# Patient Record
Sex: Male | Born: 1955 | Race: White | Hispanic: No | Marital: Married | Smoking: Never smoker
Health system: Southern US, Community
[De-identification: ages and names within clinical notes are randomized; demographics above are authoritative.]

## PROBLEM LIST (undated history)

## (undated) DIAGNOSIS — M1990 Unspecified osteoarthritis, unspecified site: Secondary | ICD-10-CM

## (undated) DIAGNOSIS — J45909 Unspecified asthma, uncomplicated: Secondary | ICD-10-CM

## (undated) DIAGNOSIS — M069 Rheumatoid arthritis, unspecified: Secondary | ICD-10-CM

## (undated) DIAGNOSIS — I2510 Atherosclerotic heart disease of native coronary artery without angina pectoris: Secondary | ICD-10-CM

## (undated) DIAGNOSIS — E785 Hyperlipidemia, unspecified: Secondary | ICD-10-CM

## (undated) DIAGNOSIS — K219 Gastro-esophageal reflux disease without esophagitis: Secondary | ICD-10-CM

## (undated) HISTORY — DX: Unspecified asthma, uncomplicated: J45.909

## (undated) HISTORY — DX: Atherosclerotic heart disease of native coronary artery without angina pectoris: I25.10

## (undated) HISTORY — DX: Hyperlipidemia, unspecified: E78.5

## (undated) HISTORY — PX: OLECRANON BURSECTOMY: SHX2097

## (undated) HISTORY — PX: APPENDECTOMY: SHX54

## (undated) HISTORY — DX: Rheumatoid arthritis, unspecified: M06.9

---

## 2009-09-27 ENCOUNTER — Encounter: Payer: Self-pay | Admitting: Pulmonary Disease

## 2010-05-18 ENCOUNTER — Ambulatory Visit: Payer: Self-pay | Admitting: Pulmonary Disease

## 2010-05-18 DIAGNOSIS — R05 Cough: Secondary | ICD-10-CM | POA: Insufficient documentation

## 2010-05-18 DIAGNOSIS — E785 Hyperlipidemia, unspecified: Secondary | ICD-10-CM | POA: Insufficient documentation

## 2010-05-18 DIAGNOSIS — M069 Rheumatoid arthritis, unspecified: Secondary | ICD-10-CM | POA: Insufficient documentation

## 2010-10-24 NOTE — Assessment & Plan Note (Signed)
Summary: consult for chronic cough   Visit Type:  Initial Consult Copy to:  Beatrix Shipper [facility]) Primary Provider/Referring Provider:  Dr. Merri Brunette  CC:  Pulmonary consult.  The patient c/o a non-prod cough.  Worse x1 year.Marland Kitchen  History of Present Illness: The pt is a 55y/o male who I have been asked to see for chronic cough.  The pt states it started approx. one year ago spontaneously, and was primarily in the am's.  He would notice audible wheezing, but minimal to no mucus.  He doesn't describe this as an uncontrollable cough, but rather something that he does in order to make a sensation of "congestion" or "airway irritation" feel better.  He was initially treated with a zpak, but cough persisted.  Over the past one year, has been up and down in severity.  The last few months, it has spontaneously improved, and currently rates a 2/10 with 10 being the worst it has been and 0 no cough.  He describes a classic upper airway pseudowheeze at night while lying down.  He has no worsening sob or change in his exertional tolerance.  The pt has no h/o childhood asthma, and has never smoked.  He has had a recent cxr that is not available to me, but the pt was told by his rheumatologist that it was clear.  He denies any postnasal drip or ongoing sinus issues.  He does have some reflux symptoms intermittantly that started a year ago, and takes TUMS as needed.    Preventive Screening-Counseling & Management  Alcohol-Tobacco     Smoking Status: never  Current Medications (verified): 1)  Enbrel 50 Mg/ml Soln (Etanercept) .... Once Weekly 2)  Trilipix 135 Mg Cpdr (Choline Fenofibrate) .Marland Kitchen.. 1 By Mouth Daily  Allergies (verified): 1)  ! * Pcn  Past History:  Past Medical History:  HYPERLIPIDEMIA (ICD-272.4) RHEUMATOID ARTHRITIS (ICD-714.0) OSA--apnea link with 32/hr.  Past Surgical History: No surgical history  Family History: Reviewed history and no changes required. No significant family  history  Social History: Reviewed history and no changes required. Patient never smoked.  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  The patient denies shortness of breath with activity, shortness of breath at rest, productive cough, coughing up blood, chest pain, irregular heartbeats, acid heartburn, indigestion, loss of appetite, weight change, abdominal pain, difficulty swallowing, sore throat, tooth/dental problems, headaches, nasal congestion/difficulty breathing through nose, sneezing, itching, ear ache, anxiety, depression, hand/feet swelling, joint stiffness or pain, rash, change in color of mucus, and fever.    Vital Signs:  Patient profile:   55 year old male Height:      70 inches (177.80 cm) Weight:      196.38 pounds (89.26 kg) BMI:     28.28 O2 Sat:      98 % on Room air Temp:     98.0 degrees F (36.67 degrees C) oral Pulse rate:   72 / minute BP sitting:   122 / 80  (left arm) Cuff size:   regular  Vitals Entered By: Michel Bickers CMA (May 18, 2010 9:28 AM)  O2 Sat at Rest %:  98 O2 Flow:  Room air CC: Pulmonary consult.  The patient c/o a non-prod cough.  Worse x1 year. Is Patient Diabetic? No Comments Medications reviewed with the patient. Daytime phone verified. Michel Bickers [facility]  May 18, 2010 9:38 AM   Physical Exam  General:  wd male in nad Eyes:  PERRLA and EOMI.   Nose:  mild turbinate hypertrophy, crusted secretions in left nare Mouth:  moderate elongation of soft palate and uvula. Neck:  no jvd, tmg, LN Lungs:  totally clear to auscultation Heart:  rrr, no mrg Abdomen:  soft and nontender, bs+ Extremities:  no edema or cyanosis  pulses intact distally Neurologic:  alert and oriented, moves all 4.   Impression & Recommendations:  Problem # 1:  COUGH (ICD-786.2)  the pt's cough sounds much more upper airway in  origin than lower.  His spirometry does not suggest cough variant asthma, but this is not 100%.  There is nothing to suggest arytenoid dysfunction related to his RA.  I suspect this is related to LPR, given his intermittant reflux symptoms that he takes TUMS for.  His cough started  about the same time that his reflux started bothering him.  I have also asked him to keep postnasal drip in mind as well, and have recommended otc antihistamine at HS if he has symptoms.  Will treat with PPI for next 3 weeks and see how he responds.  If no change, would consider methacholine challenge testing or possibly check scan of sinuses.  The pt will get his cxr for my review on disc.    Medications Added to Medication List This Visit: 1)  Enbrel 50 Mg/ml Soln (Etanercept) .... Once weekly 2)  Trilipix 135 Mg Cpdr (Choline fenofibrate) .Marland Kitchen.. 1 by mouth daily  Other Orders: Consultation Level IV (62130) Spirometry w/Graph (94010)  Patient Instructions: 1)  will try dexilant 60mg  one each night at bedtime for next 3 weeks.  Please call and give me update with how things are going. 2)  if you are noticing postnasal drip, would get chlorpheniramine 8mg  and take at bedtime as well.   CardioPerfect Spirometry  ID: [id number] Patient: Dennis Lozano, Dennis Lozano DOB: [birth year] Age: 55 Years Old Sex: Male Race: White Height: 70 Weight: 196.38 Status: Confirmed Recorded: 05/18/2010 10:12 AM  Parameter  Measured Predicted %Predicted FVC     3.44        4.84        71 FEV1     2.53        3.70        68.50 FEV1%   73.73        76.50        96.40 PEF    4.88        9.43        51.80   Interpretation: There is no obstruction based on FEV1%.  His FVC is mildly decreased either due to mild airtrapping or mild restriction.  The pt did have cough during manuevers which may contribute to the findings.

## 2010-11-02 ENCOUNTER — Other Ambulatory Visit: Payer: Self-pay | Admitting: Orthopaedic Surgery

## 2010-11-10 ENCOUNTER — Ambulatory Visit
Admission: RE | Admit: 2010-11-10 | Discharge: 2010-11-10 | Disposition: A | Discharge status: Home / Self Care | Payer: BC Managed Care – PPO | Source: Ambulatory Visit | Attending: Orthopaedic Surgery | Admitting: Orthopaedic Surgery

## 2011-02-09 ENCOUNTER — Encounter: Payer: Self-pay | Admitting: Pulmonary Disease

## 2011-02-09 ENCOUNTER — Ambulatory Visit (INDEPENDENT_AMBULATORY_CARE_PROVIDER_SITE_OTHER): Payer: BC Managed Care – PPO | Admitting: Pulmonary Disease

## 2011-02-09 VITALS — BP 120/82 | HR 64 | Temp 98.2°F | Ht 70.0 in | Wt 191.6 lb

## 2011-02-09 DIAGNOSIS — R0609 Other forms of dyspnea: Secondary | ICD-10-CM

## 2011-02-09 DIAGNOSIS — R059 Cough, unspecified: Secondary | ICD-10-CM

## 2011-02-09 DIAGNOSIS — R05 Cough: Secondary | ICD-10-CM

## 2011-02-09 NOTE — Progress Notes (Signed)
  Subjective:    Patient ID: Dennis Lozano, male    DOB: [birth year], 55 y.o.   MRN: [id number]  HPI The pt comes in today for an acute sick visit.  He was last seen in august of last year with chronic cough, and it was initially felt to be more upper airway in origin.  His lungs were clear at that time, and his spirometry was normal.  He was placed on a trial of PPI for possible LPR, and asked to call in a few weeks with his progress.  He never called, and unfortunately has had worsening cough and now worsening sob since that time.  He has been tried on prn albuterol by Dr. Renne Crigler, with no change.  He has had spirometry recently that showed very mild airflow obstruction.  The pt is very concerned this is not asthma, and is worried it may be related to his RA.  He did take some prednisone on his own recently to see if it would help, and he tells me he had resolution of his cough and dyspnea.  He has had recent cxr that unfortunately has "mottling" that can simulate IS process, but I suspect is unremarkable.  Are getting xray report for radiology opinion.    Review of Systems  Constitutional: Negative for fever and unexpected weight change.  HENT: Negative for ear pain, nosebleeds, congestion, sore throat, rhinorrhea, sneezing, trouble swallowing, dental problem, postnasal drip and sinus pressure.   Eyes: Negative for redness and itching.  Respiratory: Positive for cough, shortness of breath and wheezing. Negative for chest tightness.   Cardiovascular: Negative for palpitations and leg swelling.  Gastrointestinal: Negative for nausea and vomiting.  Genitourinary: Negative for dysuria.  Musculoskeletal: Negative for joint swelling.  Skin: Negative for rash.  Neurological: Negative for headaches.  Hematological: Does not bruise/bleed easily.  Psychiatric/Behavioral: Negative for dysphoric mood. The patient is not nervous/anxious.        Objective:   Physical Exam Thin male in nad Nares without  purulence or crusting Chest with bilat wheezing, pops, no crackles Cor with rrr LE without edema, on cyanosis  Alert and oriented,  Moves all 4        Assessment & Plan:

## 2011-02-09 NOTE — Assessment & Plan Note (Signed)
The pt's history and exam is most c/w asthma.  He is concerned this is not the right diagnosis, and that it may be related to his RA or biologic treatment.  I have had a long discussion with him about this, and have compromised that we will do full pfts with lung volumes and DLCO to further evaluate his lungs.  If DLCO is normal, very unlikely he has ISLD from RA.

## 2011-02-09 NOTE — Assessment & Plan Note (Signed)
The pt continues to have cough, and given his wheezing today and recent spirometry that suggests airflow obstruction, I suspect this is due to asthma.  He even has a +response to prednisone.

## 2011-02-09 NOTE — Patient Instructions (Signed)
Will set up for breathing studies week after next.  Please call if having worsening issues before then. Will call you once the results are available to me.

## 2011-02-12 ENCOUNTER — Telehealth: Payer: Self-pay | Admitting: *Deleted

## 2011-02-12 NOTE — Telephone Encounter (Signed)
lmomtcb for Dennis Lozano in medical records at [facility].

## 2011-02-14 NOTE — Telephone Encounter (Signed)
CXR result received and given to Dr. Shelle Iron.

## 2011-02-20 ENCOUNTER — Telehealth: Payer: Self-pay | Admitting: Pulmonary Disease

## 2011-02-20 NOTE — Telephone Encounter (Signed)
ATC pt, NA and msg states that "all circiuts are busy...". Unable to leave msg,WCB.

## 2011-02-21 NOTE — Telephone Encounter (Signed)
lmomtcb  

## 2011-02-22 NOTE — Telephone Encounter (Signed)
lmomtcb x 2  

## 2011-02-23 NOTE — Telephone Encounter (Signed)
Pt saw dr Lucie Leather for allergy problems 3-4 days ago, dr Lucie Leather wanted to start pt on treatment process right away, so pt had called [place] to get [facility] opinion on this but we played phone tag for 3-4 days so when dr Lucie Leather called back the pt went ahead and started the rec. Treatment plan which was-- prednisone he is on his 3rd day of the taper, dulera 2 pfs bid and singulair once daily and nasonex once daily, the pt states he does feel some better, but his question is [facility] set him up for full pft's and that appt is on Monday 6/4-pt would like to know if he needs to keep this appt since the meds he is on will interfere with the test per dr kozlow--pls advise

## 2011-02-23 NOTE — Telephone Encounter (Signed)
Let pt know that I thought he had asthma, and the treatment plan outlined by Dr. Lucie Leather will treat asthma well. The breathing test scheduled was to look at total lung capacity and diffusion capacity to see if RA affecting lungs.  These variables will not be influenced by meds from dr Lucie Leather.  Would go ahead and do testing as we have planned.

## 2011-02-23 NOTE — Telephone Encounter (Signed)
Pt aware dr clance wants him to keep the appt for the test--pt states he will be here monday

## 2011-02-23 NOTE — Telephone Encounter (Signed)
Phoned returning a call to triage he can be reached at [phone].[place] Nickel

## 2011-02-26 ENCOUNTER — Ambulatory Visit (INDEPENDENT_AMBULATORY_CARE_PROVIDER_SITE_OTHER): Payer: BC Managed Care – PPO | Admitting: Pulmonary Disease

## 2011-02-26 DIAGNOSIS — R05 Cough: Secondary | ICD-10-CM

## 2011-02-26 DIAGNOSIS — R0989 Other specified symptoms and signs involving the circulatory and respiratory systems: Secondary | ICD-10-CM

## 2011-02-26 LAB — PULMONARY FUNCTION TEST

## 2011-02-26 NOTE — Progress Notes (Signed)
PFT done today. 

## 2011-03-02 ENCOUNTER — Telehealth: Payer: Self-pay | Admitting: Pulmonary Disease

## 2011-03-02 NOTE — Telephone Encounter (Signed)
pfts are totally normal  Megan, please let pt know that his pfts are totally normal.  There is nothing to suggest that his RA is affecting his lungs.  I think this is asthma, and needs to continue on inhaled medications.

## 2011-03-06 NOTE — Telephone Encounter (Signed)
Called and spoke with pt.  Pt aware of PFT results and [facility]'s response/recs.  Pt verbalized understanding and denied any questions.

## 2011-03-22 ENCOUNTER — Encounter: Payer: Self-pay | Admitting: Pulmonary Disease

## 2011-12-13 ENCOUNTER — Other Ambulatory Visit: Payer: Self-pay | Admitting: *Deleted

## 2011-12-13 NOTE — Progress Notes (Signed)
error 

## 2012-05-15 ENCOUNTER — Other Ambulatory Visit: Payer: Self-pay | Admitting: *Deleted

## 2014-03-04 ENCOUNTER — Encounter: Payer: Self-pay | Admitting: *Deleted

## 2014-03-05 ENCOUNTER — Encounter: Payer: Self-pay | Admitting: Cardiovascular Disease

## 2014-03-05 ENCOUNTER — Ambulatory Visit (INDEPENDENT_AMBULATORY_CARE_PROVIDER_SITE_OTHER): Payer: BC Managed Care – PPO | Admitting: Cardiovascular Disease

## 2014-03-05 ENCOUNTER — Encounter (INDEPENDENT_AMBULATORY_CARE_PROVIDER_SITE_OTHER): Payer: Self-pay

## 2014-03-05 VITALS — BP 112/88 | HR 77 | Ht 69.5 in | Wt 196.0 lb

## 2014-03-05 DIAGNOSIS — I493 Ventricular premature depolarization: Secondary | ICD-10-CM | POA: Insufficient documentation

## 2014-03-05 DIAGNOSIS — I4949 Other premature depolarization: Secondary | ICD-10-CM

## 2014-03-05 NOTE — Progress Notes (Signed)
Dennis Lozano Date of Birth  04-20-1956       [facility]    [place] [facility] [address], [address]  [address], [address] [place]  [id number]   [place]  [id number] [phone]     [phone]   Fax  [phone]     Fax [phone]  Problem List: 1. Premature ventricular contractions   History of Present Illness:  Dennis Lozano is a 58 yo who is referred for palpitation. He was seen by his medical doctor - found to have PVCs.   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   He has an ECG app on his phone and diagnosed himself with PVCs.    His life is very consistent - same food, same exercise regimine, same sleep.   Few beers a week Nonsmoker Fhx:  Negative, father is still alive and healthy at 6488, mother diet of complication of rheumatoid arthritis.     He denies any dyspnea,  Or CP did have some extra fatigue.  He recently had an episode of hives, received steroids, 1 SQ dose of Epi.    These PVCs started about 1 week later.    He feels pretty normal today.    Current Outpatient Prescriptions on File Prior to Visit  Medication Sig Dispense Refill  . Cholecalciferol (VITAMIN D-3) 1000 UNITS CAPS Take by mouth daily.      Marland Kitchen. EPINEPHrine 0.3 mg/0.3 mL IJ SOAJ injection Inject into the muscle as needed.      . etanercept (ENBREL) 25 MG injection Inject 25 mg into the skin once a week.        . mometasone-formoterol (DULERA) 200-5 MCG/ACT AERO Inhale 2 puffs into the lungs as needed for wheezing.      . montelukast (SINGULAIR) 10 MG tablet Take 10 mg by mouth at bedtime.       No current facility-administered medications on file prior to visit.    Allergies  Allergen Reactions  . Penicillins     Past Medical History  Diagnosis Date  . Hyperlipidemia   . Rheumatoid arthritis(714.0)   . OSA (obstructive sleep apnea)     apnea link 32/hr  . Vitamin D deficiency   . Asthma     Past Surgical History  Procedure Laterality Date  .  Appendectomy    . Olecranon bursectomy      History  Smoking status  . Never Smoker   Smokeless tobacco  . Not on file    History  Alcohol Use  . Yes    Comment: occasional    Family History  Problem Relation Age of Onset  . Rheum arthritis Mother     Reviw of Systems:  Reviewed in the HPI.  All other systems are negative.  Physical Exam: Blood pressure 112/88, pulse 77, height 5' 9.5" (1.765 m), weight 196 lb (88.905 kg). Wt Readings from Last 3 Encounters:  03/05/14 196 lb (88.905 kg)  02/09/11 191 lb 9.6 oz (86.909 kg)  05/18/10 196 lb 6.1 oz (89.078 kg)     General: Well developed, well nourished, in no acute distress.  Head: Normocephalic, atraumatic, sclera non-icteric, mucus membranes are moist,   Neck: Supple. Carotids are 2 + without bruits. No JVD   Lungs: Clear   Heart: RR, normal S1S2, rare PVCs  Abdomen: Soft, non-tender, non-distended with normal bowel sounds.  Msk:  Strength and tone are normal   Extremities: No clubbing or cyanosis. No edema.  Distal pedal pulses are 2+ and equal    Neuro: CN II - XII intact.  Alert and oriented X 3.   Psych:  Normal   ECG: 03/05/2014: Normal sinus rhythm at 77. He has no ST or T wave changes.  His previous EKG at his medical doctor's office revealed normal sinus rhythm with frequent premature ventricular contractions.  Assessment / Plan:

## 2014-03-05 NOTE — Patient Instructions (Signed)
Your physician recommends that you continue on your current medications as directed. Please refer to the Current Medication list given to you today.  Your physician recommends that you schedule a follow-up appointment as needed with Dr. Nahser.  

## 2014-03-05 NOTE — Assessment & Plan Note (Signed)
Presents today for further evaluation and management of his premature ventricular contractions. Actually diagnosed himself with an apical Ms. telephone. He was able to record a very nice EKG with frequent premature ventricular contractions.  The exact etiology of these PVCs is unclear. They have greatly improved. He had an episode of hives and subsequently took steroids, antihistamines, and a dose of epinephrine. At the PVCs started following this treatment. I suspect that the PVCs are related to his steroid treatment, possible hypokalemia, epinephrine treatment or other.  He has normal exercise capacity and would suspect that his LV function is normal. I have No reason to think that he has a cardiomyopathy. At this point I think that his PVCs are benign. He can followup with us on an as-needed basis.

## 2016-03-01 ENCOUNTER — Encounter: Payer: Self-pay | Admitting: Podiatry

## 2016-03-01 ENCOUNTER — Ambulatory Visit (INDEPENDENT_AMBULATORY_CARE_PROVIDER_SITE_OTHER): Payer: BC Managed Care – PPO | Admitting: Podiatry

## 2016-03-01 VITALS — BP 152/95 | HR 71 | Resp 16 | Ht 69.5 in | Wt 190.0 lb

## 2016-03-01 DIAGNOSIS — B351 Tinea unguium: Secondary | ICD-10-CM | POA: Diagnosis not present

## 2016-03-01 DIAGNOSIS — L600 Ingrowing nail: Secondary | ICD-10-CM | POA: Diagnosis not present

## 2016-03-01 NOTE — Patient Instructions (Signed)

## 2016-03-01 NOTE — Progress Notes (Signed)
   Subjective:    Patient ID: Dennis Lozano, male    DOB: 1956-02-20, 60 y.o.   MRN: [id number]  HPI Chief Complaint  Patient presents with  . Nail Problem    Right foot; great toe-lateral; x2 weeks      Review of Systems  All other systems reviewed and are negative.      Objective:   Physical Exam        Assessment & Plan:

## 2016-03-02 NOTE — Progress Notes (Signed)
Subjective:     Patient ID: Dennis Lozano, male   DOB: Jul 24, 1956, 60 y.o.   MRN: [id number]  HPI patient presents stating I have had trouble with my nails with fungus and I have developed ingrown toenails in the outside border of both that becomes very tender when I try to wear shoe gear and I have tried to trim them and soak them without relief and it's been present for a long time   Review of Systems  All other systems reviewed and are negative.      Objective:   Physical Exam  Constitutional: He is oriented to person, place, and time.  Cardiovascular: Intact distal pulses.   Musculoskeletal: Normal range of motion.  Neurological: He is oriented to person, place, and time.  Skin: Skin is warm.  Nursing note and vitals reviewed.  neurovascular status intact muscle strength adequate range of motion within normal limits with patient found to have thickened hallux nails bilateral with yellow discoloration in the left over right nail with incurvation of the lateral borders bilateral that are painful when pressed and irritated with redness at the tip with no active drainage. 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 At this point I recommended correction of the nails and I explained procedure and risk and I infiltrated each hallux 60 mg Xylocaine Marcaine mixture remove the lateral borders exposed matrix and applied phenol 3 applications 30 seconds followed by alcohol lavage and sterile dressing. Gave instructions on soaks and reappoint

## 2016-03-09 ENCOUNTER — Telehealth: Payer: Self-pay | Admitting: *Deleted

## 2016-03-09 NOTE — Telephone Encounter (Signed)
Left message for patient at [phone] (Home #) to check to see how they were doing from their ingrown toenail procedure that was performed on Thursday, March 01, 2016. Waiting for a response.

## 2016-07-05 ENCOUNTER — Ambulatory Visit: Payer: Self-pay | Admitting: Orthopedic Surgery

## 2016-08-07 ENCOUNTER — Institutional Professional Consult (permissible substitution): Payer: BC Managed Care – PPO | Admitting: Internal Medicine

## 2016-08-09 ENCOUNTER — Ambulatory Visit (INDEPENDENT_AMBULATORY_CARE_PROVIDER_SITE_OTHER): Payer: BC Managed Care – PPO | Admitting: Internal Medicine

## 2016-08-09 ENCOUNTER — Encounter: Payer: Self-pay | Admitting: Internal Medicine

## 2016-08-09 VITALS — BP 124/64 | HR 85 | Ht 66.0 in | Wt 195.0 lb

## 2016-08-09 DIAGNOSIS — J4520 Mild intermittent asthma, uncomplicated: Secondary | ICD-10-CM | POA: Diagnosis not present

## 2016-08-09 MED ORDER — ALBUTEROL SULFATE HFA 108 (90 BASE) MCG/ACT IN AERS: 2.0000 | INHALATION_SPRAY | Freq: Four times a day (QID) | RESPIRATORY_TRACT | 2 refills | Status: DC | PRN

## 2016-08-09 MED ORDER — FLUTICASONE FUROATE-VILANTEROL 200-25 MCG/INH IN AEPB: 1.0000 | INHALATION_SPRAY | Freq: Every day | RESPIRATORY_TRACT | 0 refills | Status: AC

## 2016-08-09 MED ORDER — FLUTICASONE FUROATE-VILANTEROL 200-25 MCG/INH IN AEPB
1.0000 | INHALATION_SPRAY | Freq: Every day | RESPIRATORY_TRACT | 5 refills | Status: DC
Start: 2016-08-09 — End: 2017-08-14

## 2016-08-09 NOTE — Progress Notes (Signed)
Patient ID: Dennis Lozano, male   DOB: 1956/08/22, 60 y.o.   MRN: [id number] Patient seen in the office today and instructed on use of Breo Ellipta.  Patient expressed understanding and demonstrated technique.

## 2016-08-09 NOTE — Patient Instructions (Addendum)
START BREO 200 daily Albuterol as needed Stop Singulair START PRILOSEC DAILY   Bronchospasm, Adult A bronchospasm is when the tubes that carry air in and out of your lungs (airways) spasm or tighten. During a bronchospasm it is hard to breathe. This is because the airways get smaller. A bronchospasm can be triggered by:  Allergies. These may be to animals, pollen, food, or mold.  Infection. This is a common cause of bronchospasm.  Exercise.  Irritants. These include pollution, cigarette smoke, strong odors, aerosol sprays, and paint fumes.  Weather changes.  Stress.  Being emotional. Follow these instructions at home:  Always have a plan for getting help. Know when to call your doctor and local emergency services (911 in the [place] Know where you can get emergency care.  Only take medicines as told by your doctor.  If you were prescribed an inhaler or nebulizer machine, ask your doctor how to use it correctly. Always use a spacer with your inhaler if you were given one.  Stay calm during an attack. Try to relax and breathe more slowly.  Control your home environment:  Change your heating and air conditioning filter at least once a month.  Limit your use of fireplaces and wood stoves.  Do not  smoke. Do not  allow smoking in your home.  Avoid perfumes and fragrances.  Get rid of pests (such as roaches and mice) and their droppings.  Throw away plants if you see mold on them.  Keep your house clean and dust free.  Replace carpet with wood, tile, or vinyl flooring. Carpet can trap dander and dust.  Use allergy-proof pillows, mattress covers, and box spring covers.  Wash bed sheets and blankets every week in hot water. Dry them in a dryer.  Use blankets that are made of polyester or cotton.  Wash hands frequently. Contact a doctor if:  You have muscle aches.  You have chest pain.  The thick spit you spit or cough up (sputum) changes from clear or white to  yellow, green, gray, or bloody.  The thick spit you spit or cough up gets thicker.  There are problems that may be related to the medicine you are given such as:  A rash.  Itching.  Swelling.  Trouble breathing. Get help right away if:  You feel you cannot breathe or catch your breath.  You cannot stop coughing.  Your treatment is not helping you breathe better.  You have very bad chest pain. This information is not intended to replace advice given to you by your health care provider. Make sure you discuss any questions you have with your health care provider. Document Released: 07/08/2009 Document Revised: 02/16/2016 Document Reviewed: 03/03/2013 Elsevier Interactive Patient Education  2017 [employer]

## 2016-08-09 NOTE — Progress Notes (Signed)
[facility] Consultation      Date: 08/09/2016,   MRN# [id number] Yong ChannelRon F Hufnagle Sep 06, 1956 Code Status:  Code Status History    This patient does not have a recorded code status. Please follow your organizational policy for patients in this situation.     Hosp day:@LENGTHOFSTAYDAYS @ Referring MD: @ATDPROV @     PCP:      Admission                  Current  Yong ChannelRon F Rathmann is a 60 y.o. old male seen in consultation for ASTHMA at the request of Dr. Renne CriglerPharr.     CHIEF COMPLAINT:  wheezing   HISTORY OF PRESENT ILLNESS  60 yo white male seen today for wheezing Patient has been dx with ASTHMA in 2001 Patient has symptoms of intermittent wheezing for the last several years Has intermittent SOB, cold air makes it worse, patient has been prescribed Dulera but he takes it only as needed, not on a daily basis Patient has also been dx with mild reflux and he does NOT take his Prilosec daily He also has complaints  of early morning sputum production-tan colored but he is not bothered by it and this has been going on for many years  Patient has h/o RA and had been on chronic prednisone therapy, now on Immuno therapy Patient is having left hip surgery in 1 month  Patient has wheezing now, but is not in distress He has no signs of infection at this time Patient is non smoker, no second hand smoke exposure 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 Asthma   . Hyperlipidemia   . OSA (obstructive sleep apnea)    apnea link 32/hr  . Rheumatoid arthritis(714.0)   . Vitamin D deficiency      SURGICAL HISTORY   Past Surgical History:  Procedure Laterality  Date  . APPENDECTOMY    . OLECRANON BURSECTOMY       FAMILY HISTORY   Family History  Problem Relation Age of Onset  . Rheum arthritis Mother      SOCIAL HISTORY   Social History  Substance Use Topics  . Smoking status: Never Smoker  . Smokeless tobacco: Not on file  . Alcohol use Yes     Comment: occasional     MEDICATIONS    Home Medication:  Current Outpatient Rx  . Order #: [id number] Class: Historical Med  . Order #: [id number] Class: Historical Med  . Order #: [id number] Class: Historical Med  . Order #: [id number] Class: Historical Med  . Order #: [id number] Class: Historical Med    Current Medication:  Current Outpatient Prescriptions:  .  EPINEPHrine 0.3 mg/0.3 mL IJ SOAJ injection, Inject into the muscle as needed., Disp: , Rfl:  .  etanercept (ENBREL) 25 MG injection, Inject 50 mg into the skin once a week. , Disp: , Rfl:  .  fluconazole (DIFLUCAN) 200 MG tablet, Take 200 mg by mouth once a week., Disp: , Rfl:  .  mometasone-formoterol (DULERA) 200-5 MCG/ACT AERO, Inhale 2 puffs into the lungs as needed for wheezing., Disp: , Rfl:  .  montelukast (SINGULAIR) 10 MG tablet, Take 10 mg by mouth  at bedtime., Disp: , Rfl:     ALLERGIES   Penicillins     REVIEW OF SYSTEMS   Review of Systems  Constitutional: Negative for chills, diaphoresis, fever, malaise/fatigue and weight loss.  HENT: Negative for congestion and hearing loss.   Eyes: Negative for blurred vision and double vision.  Respiratory: Positive for cough, sputum production, shortness of breath and wheezing. Negative for hemoptysis.   Cardiovascular: Negative for chest pain, palpitations, orthopnea and leg swelling.  Gastrointestinal: Negative for abdominal pain, heartburn, nausea and vomiting.  Genitourinary: Negative for dysuria and urgency.  Musculoskeletal: Negative for back pain, myalgias and neck pain.  Skin: Negative for rash.  Neurological: Negative for dizziness, tingling, tremors,  weakness and headaches.  Endo/Heme/Allergies: Does not bruise/bleed easily.  Psychiatric/Behavioral: Negative for depression, substance abuse and suicidal ideas.  All other systems reviewed and are negative.   BP 124/64 (BP Location: Left Arm, Cuff Size: Normal)   Pulse 85   Ht 5\' 6"  (1.676 m)   Wt 195 lb (88.5 kg)   SpO2 99%   BMI 31.47 kg/m    PHYSICAL EXAM  Physical Exam  Constitutional: He is oriented to person, place, and time. He appears well-developed and well-nourished. No distress.  HENT:  Head: Normocephalic and atraumatic.  Mouth/Throat: No oropharyngeal exudate.  Eyes: EOM are normal. Pupils are equal, round, and reactive to light. No scleral icterus.  Neck: Normal range of motion. Neck supple.  Cardiovascular: Normal rate, regular rhythm and normal heart sounds.   No murmur heard. Pulmonary/Chest: No stridor. No respiratory distress. He has wheezes.  Abdominal: Soft. Bowel sounds are normal.  Musculoskeletal: Normal range of motion. He exhibits no edema.  Neurological: He is alert and oriented to person, place, and time. No cranial nerve deficit.  Skin: Skin is warm. He is not diaphoretic.  Psychiatric: He has a normal mood and affect.          IMAGING   No imaging at this time   ASSESSMENT/PLAN   60 yo white male seen today for h/o reactive airways disease with Mild Intermittent ASTHMA with chronic bronchitis with chronic sputum production with h/o Reflux , also with RA to obtain hip replacement and to evaluate risk assessment for surgery  1.will start daily dosing of inhaled steroids/LABA with BREO 200 2.albuterol as needed 3.start Daily PPI  Patient has mild risk for post op complications Recommend Incentive Spirometry post op  No bronchoscopy or prednisone needed at this time Will need to re-assess resp status in 3 weeks prior to surgery  I have personally obtained a history, examined the patient, evaluated laboratory and independently  reviewed imaging results, formulated the assessment and plan and placed orders.  The Patient requires high complexity decision making for assessment and support, frequent evaluation and titration of therapies, application of advanced monitoring technologies and extensive interpretation of multiple databases.    Patient/Family are satisfied with Plan of action and management. All questions answered  Lucie LeatherKurian David Donel Osowski, M.D.  [facility]  Medical Director [facility] Medical Director [facility]

## 2016-08-27 NOTE — Patient Instructions (Addendum)
Dennis Lozano  08/27/2016   Your procedure is scheduled on: 09/03/2016    [facility]  [facility] to 3rd floor to  Short Stay Center at    1150 AM.  Call this number if you have problems the morning of surgery [phone]   Remember: ONLY 1 PERSON MAY GO WITH YOU TO SHORT STAY TO GET  READY MORNING OF YOUR SURGERY.  Do not eat food or drink liquids :After Midnight.              You may have clear liquids from 12 midnite until 0800am   Morning of surgery  then nothing by mouth.       Take these medicines the morning of surgery with A SIP OF WATER: Albuterol Inhaler if needed and bring, Breo Ellipta and bring , )meprazole ( Prilosec)                                You may not have any metal on your body including hair pins and              piercings  Do not wear jewelry,  lotions, powders or perfumes, deodorant                           Men may shave face and neck.   Do not bring valuables to the hospital. [facility] IS NOT             RESPONSIBLE   FOR VALUABLES.  Contacts, dentures or bridgework may not be worn into surgery.  Leave suitcase in the car. After surgery it may be brought to your room.     Special Instructions: N/A              Please read over the following fact sheets you were given: _____________________________________________________________________                CLEAR LIQUID DIET   Foods Allowed                                                                     Foods Excluded  Coffee and tea, regular and decaf                             liquids that you cannot  Plain Jell-O in any flavor                                             see through such as: Fruit ices (not with fruit pulp)                                     milk, soups, orange juice  Iced Popsicles  All solid food Carbonated beverages, regular and diet                                     Cranberry, grape and apple juices Sports drinks like Gatorade Lightly seasoned clear broth or consume(fat free) Sugar, honey syrup  Sample Menu Breakfast                                Lunch                                     Supper Cranberry juice                    Beef broth                            Chicken broth Jell-O                                     Grape juice                           Apple juice Coffee or tea                        Jell-O                                      Popsicle                                                Coffee or tea                        Coffee or tea  _____________________________________________________________________  [facility] - Preparing for Surgery Before surgery, you can play an important role.  Because skin is not sterile, your skin needs to be as free of germs as possible.  You can reduce the number of germs on your skin by washing with CHG (chlorahexidine gluconate) soap before surgery.  CHG is an antiseptic cleaner which kills germs and bonds with the skin to continue killing germs even after washing. Please DO NOT use if you have an allergy to CHG or antibacterial soaps.  If your skin becomes reddened/irritated stop using the CHG and inform your nurse when you arrive at Short Stay. Do not shave (including legs and underarms) for at least 48 hours prior to the first CHG shower.  You may shave your face/neck. Please follow these instructions carefully:  1.  Shower with CHG Soap the night before surgery and the  morning of Surgery.  2.  If you choose to wash your hair, wash your hair first as usual with your  normal  shampoo.  3.  After you shampoo, rinse your hair and body thoroughly to remove the  shampoo.  4.  Use CHG as you would any other liquid soap.  You can apply chg directly  to the skin and wash                       Gently with a scrungie or clean washcloth.  5.  Apply the CHG Soap to your body ONLY  FROM THE NECK DOWN.   Do not use on face/ open                           Wound or open sores. Avoid contact with eyes, ears mouth and genitals (private parts).                       Wash face,  Genitals (private parts) with your normal soap.             6.  Wash thoroughly, paying special attention to the area where your surgery  will be performed.  7.  Thoroughly rinse your body with warm water from the neck down.  8.  DO NOT shower/wash with your normal soap after using and rinsing off  the CHG Soap.                9.  Pat yourself dry with a clean towel.            10.  Wear clean pajamas.            11.  Place clean sheets on your bed the night of your first shower and do not  sleep with pets. Day of Surgery : Do not apply any lotions/deodorants the morning of surgery.  Please wear clean clothes to the hospital/surgery center.  FAILURE TO FOLLOW THESE INSTRUCTIONS MAY RESULT IN THE CANCELLATION OF YOUR SURGERY PATIENT SIGNATURE_________________________________  NURSE SIGNATURE__________________________________  ________________________________________________________________________  WHAT IS A BLOOD TRANSFUSION? Blood Transfusion Information  A transfusion is the replacement of blood or some of its parts. Blood is made up of multiple cells which provide different functions.  Red blood cells carry oxygen and are used for blood loss replacement.  White blood cells fight against infection.  Platelets control bleeding.  Plasma helps clot blood.  Other blood products are available for specialized needs, such as hemophilia or other clotting disorders. BEFORE THE TRANSFUSION  Who gives blood for transfusions?   Healthy volunteers who are fully evaluated to make sure their blood is safe. This is blood bank blood. Transfusion therapy is the safest it has ever been in the practice of medicine. Before blood is taken from a donor, a complete history is taken to make sure that person has  no history of diseases nor engages in risky social behavior (examples are intravenous drug use or sexual activity with multiple partners). The donor's travel history is screened to minimize risk of transmitting infections, such as malaria. The donated blood is tested for signs of infectious diseases, such as HIV and hepatitis. The blood is then tested to be sure it is compatible with you in order to minimize the chance of a transfusion reaction. If you or a relative donates blood, this is often done in anticipation of surgery and is not appropriate for emergency situations. It takes many days to process the donated blood. RISKS AND COMPLICATIONS Although transfusion therapy is very safe and saves many lives, the main dangers of transfusion include:   Getting an infectious disease.  Developing a transfusion reaction.  This is an allergic reaction to something in the blood you were given. Every precaution is taken to prevent this. The decision to have a blood transfusion has been considered carefully by your caregiver before blood is given. Blood is not given unless the benefits outweigh the risks. AFTER THE TRANSFUSION  Right after receiving a blood transfusion, you will usually feel much better and more energetic. This is especially true if your red blood cells have gotten low (anemic). The transfusion raises the level of the red blood cells which carry oxygen, and this usually causes an energy increase.  The nurse administering the transfusion will monitor you carefully for complications. HOME CARE INSTRUCTIONS  No special instructions are needed after a transfusion. You may find your energy is better. Speak with your caregiver about any limitations on activity for underlying diseases you may have. SEEK MEDICAL CARE IF:   Your condition is not improving after your transfusion.  You develop redness or irritation at the intravenous (IV) site. SEEK IMMEDIATE MEDICAL CARE IF:  Any of the following  symptoms occur over the next 12 hours:  Shaking chills.  You have a temperature by mouth above 102 F (38.9 C), not controlled by medicine.  Chest, back, or muscle pain.  People around you feel you are not acting correctly or are confused.  Shortness of breath or difficulty breathing.  Dizziness and fainting.  You get a rash or develop hives.  You have a decrease in urine output.  Your urine turns a dark color or changes to pink, red, or brown. Any of the following symptoms occur over the next 10 days:  You have a temperature by mouth above 102 F (38.9 C), not controlled by medicine.  Shortness of breath.  Weakness after normal activity.  The white part of the eye turns yellow (jaundice).  You have a decrease in the amount of urine or are urinating less often.  Your urine turns a dark color or changes to pink, red, or brown. Document Released: 09/07/2000 Document Revised: 12/03/2011 Document Reviewed: 04/26/2008 Exit[employer] Patient Information 2014 [place].  _______________________________________________________________________  Incentive Spirometer  An incentive spirometer is a tool that can help keep your lungs clear and active. This tool measures how well you are filling your lungs with each breath. Taking long deep breaths may help reverse or decrease the chance of developing breathing (pulmonary) problems (especially infection) following:  A long period of time when you are unable to move or be active. BEFORE THE PROCEDURE   If the spirometer includes an indicator to show your best effort, your nurse or respiratory therapist will set it to a desired goal.  If possible, sit up straight or lean slightly forward. Try not to slouch.  Hold the incentive spirometer in an upright position. INSTRUCTIONS FOR USE  1. Sit on the edge of your bed if possible, or sit up as far as you can in bed or on a chair. 2. Hold the incentive spirometer in an upright  position. 3. Breathe out normally. 4. Place the mouthpiece in your mouth and seal your lips tightly around it. 5. Breathe in slowly and as deeply as possible, raising the piston or the ball toward the top of the column. 6. Hold your breath for 3-5 seconds or for as long as possible. Allow the piston or ball to fall to the bottom of the column. 7. Remove the mouthpiece from your mouth and breathe out normally. 8. Rest for a few seconds and repeat Steps  1 through 7 at least 10 times every 1-2 hours when you are awake. Take your time and take a few normal breaths between deep breaths. 9. The spirometer may include an indicator to show your best effort. Use the indicator as a goal to work toward during each repetition. 10. After each set of 10 deep breaths, practice coughing to be sure your lungs are clear. If you have an incision (the cut made at the time of surgery), support your incision when coughing by placing a pillow or rolled up towels firmly against it. Once you are able to get out of bed, walk around indoors and cough well. You may stop using the incentive spirometer when instructed by your caregiver.  RISKS AND COMPLICATIONS  Take your time so you do not get dizzy or light-headed.  If you are in pain, you may need to take or ask for pain medication before doing incentive spirometry. It is harder to take a deep breath if you are having pain. AFTER USE  Rest and breathe slowly and easily.  It can be helpful to keep track of a log of your progress. Your caregiver can provide you with a simple table to help with this. If you are using the spirometer at home, follow these instructions: [place] IF:   You are having difficultly using the spirometer.  You have trouble using the spirometer as often as instructed.  Your pain medication is not giving enough relief while using the spirometer.  You develop fever of 100.5 F (38.1 C) or higher. SEEK IMMEDIATE MEDICAL CARE IF:    You cough up bloody sputum that had not been present before.  You develop fever of 102 F (38.9 C) or greater.  You develop worsening pain at or near the incision site. MAKE SURE YOU:   Understand these instructions.  Will watch your condition.  Will get help right away if you are not doing well or get worse. Document Released: 01/21/2007 Document Revised: 12/03/2011 Document Reviewed: 03/24/2007 [facility] Patient Information 2014 [place], [place].   ________________________________________________________________________

## 2016-08-28 ENCOUNTER — Encounter (HOSPITAL_COMMUNITY)
Admission: RE | Admit: 2016-08-28 | Discharge: 2016-08-28 | Disposition: A | Discharge status: Home / Self Care | Payer: BC Managed Care – PPO | Source: Ambulatory Visit | Attending: Orthopedic Surgery | Admitting: Orthopedic Surgery

## 2016-08-28 ENCOUNTER — Encounter (HOSPITAL_COMMUNITY): Payer: Self-pay

## 2016-08-28 DIAGNOSIS — Z01818 Encounter for other preprocedural examination: Secondary | ICD-10-CM | POA: Insufficient documentation

## 2016-08-28 DIAGNOSIS — M1612 Unilateral primary osteoarthritis, left hip: Secondary | ICD-10-CM | POA: Diagnosis not present

## 2016-08-28 HISTORY — DX: Gastro-esophageal reflux disease without esophagitis: K21.9

## 2016-08-28 HISTORY — DX: Unspecified osteoarthritis, unspecified site: M19.90

## 2016-08-28 LAB — CBC
HCT: 42.9 % (ref 39.0–52.0)
Hemoglobin: 14.9 g/dL (ref 13.0–17.0)
MCH: 30.5 pg (ref 26.0–34.0)
MCHC: 34.7 g/dL (ref 30.0–36.0)
MCV: 87.9 fL (ref 78.0–100.0)
PLATELETS: 206 10*3/uL (ref 150–400)
RBC: 4.88 MIL/uL (ref 4.22–5.81)
RDW: 12.9 % (ref 11.5–15.5)
WBC: 7.8 10*3/uL (ref 4.0–10.5)

## 2016-08-28 LAB — URINALYSIS, ROUTINE W REFLEX MICROSCOPIC
BACTERIA UA: NONE SEEN
Bilirubin Urine: NEGATIVE
GLUCOSE, UA: NEGATIVE mg/dL
Ketones, ur: NEGATIVE mg/dL
Leukocytes, UA: NEGATIVE
Nitrite: NEGATIVE
PH: 6 (ref 5.0–8.0)
Protein, ur: NEGATIVE mg/dL
SPECIFIC GRAVITY, URINE: 1.012 (ref 1.005–1.030)
SQUAMOUS EPITHELIAL / LPF: NONE SEEN
WBC, UA: NONE SEEN WBC/hpf (ref 0–5)

## 2016-08-28 LAB — PROTIME-INR
INR: 0.97
PROTHROMBIN TIME: 12.9 s (ref 11.4–15.2)

## 2016-08-28 LAB — COMPREHENSIVE METABOLIC PANEL
ALT: 22 U/L (ref 17–63)
AST: 26 U/L (ref 15–41)
Albumin: 4.1 g/dL (ref 3.5–5.0)
Alkaline Phosphatase: 62 U/L (ref 38–126)
Anion gap: 7 (ref 5–15)
BUN: 15 mg/dL (ref 6–20)
CHLORIDE: 102 mmol/L (ref 101–111)
CO2: 28 mmol/L (ref 22–32)
Calcium: 9 mg/dL (ref 8.9–10.3)
Creatinine, Ser: 1.13 mg/dL (ref 0.61–1.24)
Glucose, Bld: 84 mg/dL (ref 65–99)
POTASSIUM: 3.7 mmol/L (ref 3.5–5.1)
SODIUM: 137 mmol/L (ref 135–145)
Total Bilirubin: 1.3 mg/dL — ABNORMAL HIGH (ref 0.3–1.2)
Total Protein: 7.4 g/dL (ref 6.5–8.1)

## 2016-08-28 LAB — APTT: aPTT: 25 seconds (ref 24–36)

## 2016-08-28 LAB — SURGICAL PCR SCREEN
MRSA, PCR: NEGATIVE
STAPHYLOCOCCUS AUREUS: POSITIVE — AB

## 2016-08-28 LAB — ABO/RH: ABO/RH(D): B POS

## 2016-08-28 NOTE — Progress Notes (Signed)
07/25/2016- LOV- Dr Renne CriglerPharr on chart  07/25/2016- EKG and CXR on chart

## 2016-08-28 NOTE — Progress Notes (Signed)
U/A dopne 08/28/16 faxed via EPIC to Dr Lequita HaltAluisio.

## 2016-08-29 ENCOUNTER — Ambulatory Visit (INDEPENDENT_AMBULATORY_CARE_PROVIDER_SITE_OTHER): Payer: BC Managed Care – PPO | Admitting: Internal Medicine

## 2016-08-29 ENCOUNTER — Encounter: Payer: Self-pay | Admitting: Internal Medicine

## 2016-08-29 VITALS — BP 130/82 | HR 78 | Wt 198.0 lb

## 2016-08-29 DIAGNOSIS — J4520 Mild intermittent asthma, uncomplicated: Secondary | ICD-10-CM

## 2016-08-29 MED ORDER — FLUTICASONE FUROATE-VILANTEROL 200-25 MCG/INH IN AEPB: 1.0000 | INHALATION_SPRAY | Freq: Every day | RESPIRATORY_TRACT | 0 refills | Status: AC

## 2016-08-29 NOTE — Progress Notes (Signed)
[facility]      Date: 08/29/2016,   MRN# [id number] Dennis Lozano [birth year] Code Status:  Code Status History    This patient does not have a recorded code status. Please follow your organizational policy for patients in this situation.     Hosp day:@LENGTHOFSTAYDAYS @ Referring MD: @ATDPROV @     PCP:      Admission                  Current  Dennis Lozano is a 60 y.o. old male seen in consultation for ASTHMA at the request of Dr. Renne CriglerPharr.     CHIEF COMPLAINT:  FOLLOW UP ASTHMA   HISTORY OF PRESENT ILLNESS  60 yo white male seen today for follow up wheezing/ASTHMA breo seems not to have helped much, but has NOT used albuterol inhaler over several months  Patient has been dx with ASTHMA in 2001 Patient has symptoms of intermittent wheezing for the last several years  Patient has also been dx with mild reflux and he does NOT take his Prilosec daily He also has complaints  of early morning sputum production-tan colored but he is not bothered by it and this has been going on for many years  Patient has h/o RA and had been on chronic prednisone therapy, now on Immuno therapy  He has no signs of infection at this time Patient is non smoker, no second hand smoke exposure 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  acetaminophen (TYLENOL) 500 MG tablet, Take 1,000-1,500 mg by mouth 2 (two) times daily as needed for moderate pain or headache., Disp: , Rfl:  .  albuterol (PROVENTIL HFA;VENTOLIN HFA) 108 (90 Base) MCG/ACT inhaler, Inhale 2 puffs into the lungs every 6 (six) hours as needed for wheezing or shortness of breath., Disp: 1 Inhaler,  Rfl: 2 .  ciprofloxacin (CIPRO) 750 MG tablet, Take 750 mg by mouth daily., Disp: , Rfl:  .  clindamycin (CLEOCIN T) 1 % external solution, Apply 1 application topically 3 (three) times a week. To scalp, Disp: , Rfl:  .  EPINEPHrine 0.3 mg/0.3 mL IJ SOAJ injection, Inject 0.3 mg into the muscle as needed (allergic reaction). , Disp: , Rfl:  .  etanercept (ENBREL) 25 MG injection, Inject 50 mg into the skin once a week. , Disp: , Rfl:  .  fluconazole (DIFLUCAN) 200 MG tablet, Take 200 mg by mouth once a week., Disp: , Rfl:  .  fluticasone furoate-vilanterol (BREO ELLIPTA) 200-25 MCG/INH AEPB, Inhale 1 puff into the lungs daily., Disp: 30 each, Rfl: 5 .  omeprazole (PRILOSEC OTC) 20 MG tablet, Take 20 mg by mouth daily., Disp: , Rfl:     ALLERGIES   Penicillins     REVIEW OF SYSTEMS   Review of Systems  Constitutional: Negative for chills, diaphoresis, fever, malaise/fatigue and weight loss.  HENT: Negative for congestion and hearing loss.   Eyes: Negative for blurred vision and double vision.  Respiratory: Positive for sputum production and wheezing. Negative for cough and shortness of breath.   Cardiovascular: Negative for chest pain, palpitations, orthopnea and leg swelling.  Gastrointestinal: Negative for abdominal pain, heartburn, nausea and vomiting.  Skin: Negative for rash.  Neurological: Negative for weakness.  Endo/Heme/Allergies: Does not bruise/bleed easily.  All other systems reviewed and are negative.   BP 130/82 (BP Location: Left Arm, Cuff Size: Normal)   Pulse 78   Wt 198 lb (89.8 kg)   SpO2 97%   BMI 29.24 kg/m    PHYSICAL EXAM  Physical Exam  Constitutional: He is oriented to person, place, and time. He appears well-developed and well-nourished. No distress.  HENT:  Mouth/Throat: No oropharyngeal exudate.  Neck: Neck supple.  Cardiovascular: Normal rate, regular rhythm and normal heart sounds.   No murmur heard. Pulmonary/Chest: No stridor. No  respiratory distress. He has no wheezes.  Musculoskeletal: Normal range of motion. He exhibits no edema.  Neurological: He is alert and oriented to person, place, and time. No cranial nerve deficit.  Skin: Skin is warm. He is not diaphoretic.  Psychiatric: He has a normal mood and affect.            ASSESSMENT/PLAN   60 yo white male seen today for h/o reactive airways disease with Mild Intermittent ASTHMA with chronic bronchitis with chronic sputum production with h/o Reflux , also with RA to obtain hip replacement and to evaluate risk assessment for surgery  1.continue inhaled steroids/LABA with BREO 200 2.albuterol as needed 3.daily PPI  Patient has low/mild risk for post op complications Recommend Incentive Spirometry post op  No bronchoscopy or prednisone needed at this time Follow in 6 months   The Patient requires high complexity decision making for assessment and support, frequent evaluation and titration of therapies, application of advanced monitoring technologies and extensive interpretation of multiple databases.    Patient satisfied with Plan of action and management. All questions answered  Lucie LeatherKurian David Bennie Chirico, M.D.  [facility]  Medical Director [facility] Medical Director [facility]

## 2016-08-29 NOTE — Addendum Note (Signed)
Addended by: Maxwell MarionBLANKENSHIP, MARGIE A on: 08/29/2016 09:10 AM   Modules accepted: Orders

## 2016-08-29 NOTE — Patient Instructions (Signed)
Follow up in 6 months Continue BREO as prescibed ALbuterol as needed   Asthma Attack Prevention, Adult Although you may not be able to control the fact that you have asthma, you can take actions to prevent episodes of asthma (asthma attacks). These actions include:  Creating a written plan for managing and treating your asthma attacks (asthma action plan).  Monitoring your asthma.  Avoiding things that can irritate your airways or make your asthma symptoms worse (asthma triggers).  Taking your medicines as directed.  Acting quickly if you have signs or symptoms of an asthma attack. What are some ways to prevent an asthma attack? Create a plan Work with your health care provider to create an asthma action plan. This plan should include:  A list of your asthma triggers and how to avoid them.  A list of symptoms that you experience during an asthma attack.  Information about when to take medicine and how much medicine to take.  Information to help you understand your peak flow measurements.  Contact information for your health care providers.  Daily actions that you can take to control asthma. Monitor your asthma   To monitor your asthma:  Use your peak flow meter every morning and every evening for 2-3 weeks. Record the results in a journal. A drop in your peak flow numbers on one or more days may mean that you are starting to have an asthma attack, even if you are not having symptoms.  When you have asthma symptoms, write them down in a journal. Avoid asthma triggers   Work with your health care provider to find out what your asthma triggers are. This can be done by:  Being tested for allergies.  Keeping a journal that notes when asthma attacks occur and what may have contributed to them.  Asking your health care provider whether other medical conditions make your asthma worse. Common asthma triggers include:  Dust.  Smoke. This includes campfire smoke and  secondhand smoke from tobacco products.  Pet dander.  Trees, grasses or pollens.  Very cold, dry, or humid air.  Mold.  Foods that contain high amounts of sulfites.  Strong smells.  Engine exhaust and air pollution.  Aerosol sprays and fumes from household cleaners.  Household pests and their droppings, including dust mites and cockroaches.  Certain medicines, including NSAIDs. Once you have determined your asthma triggers, take steps to avoid them. Depending on your triggers, you may be able to reduce the chance of an asthma attack by:  Keeping your home clean. Have someone dust and vacuum your home for you 1 or 2 times a week. If possible, have them use a high-efficiency particulate arrestance (HEPA) vacuum.  Washing your sheets weekly in hot water.  Using allergy-proof mattress covers and casings on your bed.  Keeping pets out of your home.  Taking care of mold and water problems in your home.  Avoiding areas where people smoke.  Avoiding using strong perfumes or odor sprays.  Avoid spending a lot of time outdoors when pollen counts are high and on very windy days.  Talking with your health care provider before stopping or starting any new medicines. Medicines Take over-the-counter and prescription medicines only as told by your health care provider. Many asthma attacks can be prevented by carefully following your medicine schedule. Taking your medicines correctly is especially important when you cannot avoid certain asthma triggers. Even if you are doing well, do not stop taking your medicine and do not take less  medicine. Act quickly If an asthma attack happens, acting quickly can decrease how severe it is and how long it lasts. Take these actions:  Pay attention to your symptoms. If you are coughing, wheezing, or having difficulty breathing, do not wait to see if your symptoms go away on their own. Follow your asthma action plan.  If you have followed your asthma  action plan and your symptoms are not improving, call your health care provider or seek immediate medical care at the nearest hospital. It is important to write down how often you need to use your fast-acting rescue inhaler. You can track how often you use an inhaler in your journal. If you are using your rescue inhaler more often, it may mean that your asthma is not under control. Adjusting your asthma treatment plan may help you to prevent future asthma attacks and help you to gain better control of your condition. How can I prevent an asthma attack when I exercise?   Exercise is a common asthma trigger. To prevent asthma attacks during exercise:  Follow advice from your health care provider about whether you should use your fast-acting inhaler before exercising. Many people with asthma experience exercise-induced bronchoconstriction (EIB). This condition often worsens during vigorous exercise in cold, humid, or dry environments. Usually, people with EIB can stay very active by using a fast-acting inhaler before exercising.  Avoid exercising outdoors in very cold or humid weather.  Avoid exercising outdoors when pollen counts are high.  Warm up and cool down when exercising.  Stop exercising right away if asthma symptoms start. Consider taking part in exercises that are less likely to cause asthma symptoms such as:  Indoor swimming.  Biking.  Walking.  Hiking.  Playing football. This information is not intended to replace advice given to you by your health care provider. Make sure you discuss any questions you have with your health care provider. Document Released: 08/29/2009 Document Revised: 05/11/2016 Document Reviewed: 02/25/2016 Elsevier Interactive Patient Education  2017 [employer]

## 2016-08-30 ENCOUNTER — Ambulatory Visit: Payer: BC Managed Care – PPO | Admitting: Internal Medicine

## 2016-09-02 ENCOUNTER — Ambulatory Visit: Payer: Self-pay | Admitting: Orthopedic Surgery

## 2016-09-02 NOTE — H&P (Signed)
Dennis Lozano DOB: 05/29/1956 Undefined / Language: English / Race: White Male Date of Admission:  09/03/2016 CC:  Left hip pain History of Present Illness The patient is a 60 year old male who comes in for a preoperative History and Physical. The patient is scheduled for a left total hip arthroplasty (anterior) to be performed by Dr. Frank V. Aluisio, MD at [facility] on 09/03/2016. The patient is a 59 year old male who presented with a hip problem. The patient reports left hip problems including pain (weightbearing), grinding and giving way symptoms that have been present for 2 year(s). The symptoms began without any known injury. Symptoms reported include hip pain, weakness, pain with weightbearing and grinding The patient reports symptoms radiating to the: left groin. The patient describes the hip problem as sharp and throbbing. The symptoms are described as severe.The patient feels as if their symptoms are does feel they are worsening. Current treatment includes nonsteroidal anti-inflammatory drugs. Previous workup for this problem has included pelvic x-rays and hip x-rays. 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 He has been told he has arthritis in the hip and at some point he need a hip replacement. He states that the left hip is hurting him at all times now. It is limiting what he can and cannot do. He would like to do more activity, but the hip is now preventing him from doing so. Problems has been going on for quite a while much worse in the past six months to one year. It is now starting to bother him at night also. He is not having any current pain with his right hip. The left hip pain starts in the groin and will radiate down to his knee. He definitely notices limitations in motion. It is getting harder to do a lot of the activities he desires. AP pelvis and lateral of the left hip show that he has advanced end stage arthritis of that left hip, bone on bone with  significant osteophyte formation, subchondral cystic formation. He does have some degeneration on the right, but very mild compared to the left. He has got advanced end stage arthritis of the left hip with progressive pain and dysfunction. At this point, the most predictable means of long term improvement in pain and function will be total hip arthroplasty. He is ready to proceed. They have been treated conservatively in the past for the above stated problem and despite conservative measures, they continue to have progressive pain and severe functional limitations and dysfunction. They have failed non-operative management including home exercise, medications. It is felt that they would benefit from undergoing total joint replacement. Risks and benefits of the procedure have been discussed with the patient and they elect to proceed with surgery. There are no active contraindications to surgery such as ongoing infection or rapidly progressive neurological disease.  Problem List/Past Medical Primary osteoarthritis of left hip (M16.12)  Gastroesophageal Reflux Disease  Rheumatoid Arthritis  Asthma  Mild Shingles  Past History - 08/2007  Allergies Penicillins   Family History Rheumatoid Arthritis  Mother.  Social History Current drinker  11/24/2015: Currently drinks beer only occasionally per week Current work status  working full time Exercise  Exercises weekly; does running / walking No history of drug/alcohol rehab  Not under pain contract  Number of flights of stairs before winded  4-5 Tobacco use  Never smoker. 11/24/2015  Medication History Enbrel SureClick (50MG/ML Soln Auto-inj, Subcutaneous) Active. ProAir HFA (108 (90 Base)MCG/ACT   Aerosol Soln, Inhalation) Active. Singulair (10MG  Tablet, Oral) Active. Prilosec Active. Tums Active.  Past Surgical History Appendectomy    Review of Systems General Not Present- Chills, Fatigue, Fever, Memory Loss, Night  Sweats, Weight Gain and Weight Loss. Skin Not Present- Eczema, Hives, Itching, Lesions and Rash. HEENT Not Present- Dentures, Double Vision, Headache, Hearing Loss, Tinnitus and Visual Loss. Respiratory Not Present- Allergies, Chronic Cough, Coughing up blood, Shortness of breath at rest and Shortness of breath with exertion. Cardiovascular Not Present- Chest Pain, Difficulty Breathing Lying Down, Murmur, Palpitations, Racing/skipping heartbeats and Swelling. Gastrointestinal Not Present- Abdominal Pain, Bloody Stool, Constipation, Diarrhea, Difficulty Swallowing, Heartburn, Jaundice, Loss of appetitie, Nausea and Vomiting. Male Genitourinary Not Present- Blood in Urine, Discharge, Flank Pain, Incontinence, Painful Urination, Urgency, Urinary frequency, Urinary Retention, Urinating at Night and Weak urinary stream. Musculoskeletal Present- Joint Pain. Not Present- Back Pain, Joint Swelling, Morning Stiffness, Muscle Pain, Muscle Weakness and Spasms. Neurological Not Present- Blackout spells, Difficulty with balance, Dizziness, Paralysis, Tremor and Weakness. Psychiatric Not Present- Insomnia.  Vitals Weight: 190 lb Height: 69in Weight was reported by patient. Height was reported by patient. Body Surface Area: 2.02 m Body Mass Index: 28.06 kg/m  Pulse: 76 (Regular)  BP: 132/80 (Sitting, Right Arm, Standard)  Physical Exam General Mental Status -Alert, cooperative and good historian. General Appearance-pleasant, Not in acute distress. Orientation-Oriented X3. Build & Nutrition-Well nourished and Well developed.  Head and Neck Head-normocephalic, atraumatic . Neck Global Assessment - supple, no bruit auscultated on the right, no bruit auscultated on the left.  Eye Pupil - Bilateral-Regular and Round. Motion - Bilateral-EOMI.  Chest and Lung Exam Auscultation Breath sounds - clear at anterior chest wall and clear at posterior chest wall. Adventitious sounds -  No Adventitious sounds.  Cardiovascular Auscultation Rhythm - Regular rate and rhythm. Heart Sounds - S1 WNL and S2 WNL. Murmurs & Other Heart Sounds - Auscultation of the heart reveals - No Murmurs.  Abdomen Palpation/Percussion Tenderness - Abdomen is non-tender to palpation. Rigidity (guarding) - Abdomen is soft. Auscultation Auscultation of the abdomen reveals - Bowel sounds normal.  Male Genitourinary Note: Not done, not pertinent to present illness   Musculoskeletal Note: On exam, he is a well-developed male, alert and oriented, no apparent distress. Evaluation of his right hip flexion to about 110, rotation in 10, out 30, abduction 30 with slight discomfort. His left hip has flexion to 100, no internal rotation, only about 10 degrees of external rotation, 10 degrees of abduction. He does have pain on range of motion. His knee exam is normal bilaterally. Pulse, sensation, motor intact throughout.  RADIOGRAPHS AP pelvis and lateral of the left hip show that he has advanced end stage arthritis of that left hip, bone on bone with significant osteophyte formation, subchondral cystic formation. He does have some degeneration on the right, but very mild compared to the left.   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 Merri BrunetteWalter Pharr  IV TXA  Signed electronically by Beckey RutterAlezandrew L Tiant Peixoto, III PA-C

## 2016-09-03 ENCOUNTER — Inpatient Hospital Stay (HOSPITAL_COMMUNITY): Payer: BC Managed Care – PPO | Admitting: Anesthesiology

## 2016-09-03 ENCOUNTER — Encounter (HOSPITAL_COMMUNITY)
Admission: RE | Disposition: A | Discharge status: Home Health | Payer: Self-pay | Source: Ambulatory Visit | Attending: Orthopedic Surgery

## 2016-09-03 ENCOUNTER — Inpatient Hospital Stay (HOSPITAL_COMMUNITY): Payer: BC Managed Care – PPO

## 2016-09-03 ENCOUNTER — Inpatient Hospital Stay (HOSPITAL_COMMUNITY)
Admission: RE | Admit: 2016-09-03 | Discharge: 2016-09-04 | DRG: 470 | Disposition: A | Discharge status: Home Health | Payer: BC Managed Care – PPO | Source: Ambulatory Visit | Attending: Orthopedic Surgery | Admitting: Orthopedic Surgery

## 2016-09-03 ENCOUNTER — Encounter (HOSPITAL_COMMUNITY): Payer: Self-pay | Admitting: *Deleted

## 2016-09-03 DIAGNOSIS — M25552 Pain in left hip: Secondary | ICD-10-CM | POA: Diagnosis present

## 2016-09-03 DIAGNOSIS — E785 Hyperlipidemia, unspecified: Secondary | ICD-10-CM | POA: Diagnosis present

## 2016-09-03 DIAGNOSIS — M169 Osteoarthritis of hip, unspecified: Secondary | ICD-10-CM | POA: Diagnosis present

## 2016-09-03 DIAGNOSIS — M1612 Unilateral primary osteoarthritis, left hip: Principal | ICD-10-CM | POA: Diagnosis present

## 2016-09-03 DIAGNOSIS — Z96649 Presence of unspecified artificial hip joint: Secondary | ICD-10-CM

## 2016-09-03 DIAGNOSIS — K219 Gastro-esophageal reflux disease without esophagitis: Secondary | ICD-10-CM | POA: Diagnosis present

## 2016-09-03 HISTORY — PX: TOTAL HIP ARTHROPLASTY: SHX124

## 2016-09-03 LAB — TYPE AND SCREEN
ABO/RH(D): B POS
ANTIBODY SCREEN: NEGATIVE

## 2016-09-03 SURGERY — ARTHROPLASTY, HIP, TOTAL, ANTERIOR APPROACH
Anesthesia: Spinal | Site: Hip | Laterality: Left

## 2016-09-03 MED ORDER — TRAMADOL HCL 50 MG PO TABS
50.0000 mg | ORAL_TABLET | Freq: Four times a day (QID) | ORAL | Status: DC | PRN
  Administered 2016-09-04: 15:00:00 100 mg via ORAL
  Filled 2016-09-03: qty 2

## 2016-09-03 MED ORDER — FLEET ENEMA 7-19 GM/118ML RE ENEM: 1.0000 | ENEMA | Freq: Once | RECTAL | Status: DC | PRN

## 2016-09-03 MED ORDER — ONDANSETRON HCL 4 MG PO TABS: 4.0000 mg | ORAL_TABLET | Freq: Four times a day (QID) | ORAL | Status: DC | PRN

## 2016-09-03 MED ORDER — SODIUM CHLORIDE 0.9 % IV SOLN: INTRAVENOUS | Status: DC

## 2016-09-03 MED ORDER — MIDAZOLAM HCL 2 MG/2ML IJ SOLN
INTRAMUSCULAR | Status: AC
  Filled 2016-09-03: qty 2

## 2016-09-03 MED ORDER — ONDANSETRON HCL 4 MG/2ML IJ SOLN
INTRAMUSCULAR | Status: AC
  Filled 2016-09-03: qty 2

## 2016-09-03 MED ORDER — EPHEDRINE 5 MG/ML INJ
INTRAVENOUS | Status: AC
  Filled 2016-09-03: qty 10

## 2016-09-03 MED ORDER — POLYETHYLENE GLYCOL 3350 17 G PO PACK: 17.0000 g | PACK | Freq: Every day | ORAL | Status: DC | PRN

## 2016-09-03 MED ORDER — ACETAMINOPHEN 325 MG PO TABS: 650.0000 mg | ORAL_TABLET | Freq: Four times a day (QID) | ORAL | Status: DC | PRN

## 2016-09-03 MED ORDER — OMEPRAZOLE MAGNESIUM 20 MG PO TBEC: 20.0000 mg | DELAYED_RELEASE_TABLET | Freq: Every day | ORAL | Status: DC

## 2016-09-03 MED ORDER — CEFAZOLIN SODIUM-DEXTROSE 2-4 GM/100ML-% IV SOLN: 2.0000 g | Freq: Four times a day (QID) | INTRAVENOUS | Status: DC

## 2016-09-03 MED ORDER — MENTHOL 3 MG MT LOZG: 1.0000 | LOZENGE | OROMUCOSAL | Status: DC | PRN

## 2016-09-03 MED ORDER — OMEPRAZOLE 20 MG PO CPDR
20.0000 mg | DELAYED_RELEASE_CAPSULE | Freq: Every day | ORAL | Status: DC
  Administered 2016-09-04: 20 mg via ORAL
  Filled 2016-09-03: qty 1

## 2016-09-03 MED ORDER — DOCUSATE SODIUM 100 MG PO CAPS
100.0000 mg | ORAL_CAPSULE | Freq: Two times a day (BID) | ORAL | Status: DC
  Administered 2016-09-03 – 2016-09-04 (×2): 100 mg via ORAL
  Filled 2016-09-03 (×2): qty 1

## 2016-09-03 MED ORDER — LIDOCAINE 2% (20 MG/ML) 5 ML SYRINGE
INTRAMUSCULAR | Status: AC
  Filled 2016-09-03: qty 5

## 2016-09-03 MED ORDER — DEXTROSE 5 % IV SOLN
500.0000 mg | Freq: Four times a day (QID) | INTRAVENOUS | Status: DC | PRN
  Filled 2016-09-03: qty 5

## 2016-09-03 MED ORDER — METOCLOPRAMIDE HCL 5 MG PO TABS: 5.0000 mg | ORAL_TABLET | Freq: Three times a day (TID) | ORAL | Status: DC | PRN

## 2016-09-03 MED ORDER — PHENOL 1.4 % MT LIQD
1.0000 | OROMUCOSAL | Status: DC | PRN
  Filled 2016-09-03: qty 177

## 2016-09-03 MED ORDER — FENTANYL CITRATE (PF) 100 MCG/2ML IJ SOLN
INTRAMUSCULAR | Status: AC
  Filled 2016-09-03: qty 2

## 2016-09-03 MED ORDER — ACETAMINOPHEN 10 MG/ML IV SOLN
1000.0000 mg | Freq: Once | INTRAVENOUS | Status: AC
  Administered 2016-09-03: 1000 mg via INTRAVENOUS

## 2016-09-03 MED ORDER — 0.9 % SODIUM CHLORIDE (POUR BTL) OPTIME
TOPICAL | Status: DC | PRN
  Administered 2016-09-03: 1000 mL

## 2016-09-03 MED ORDER — DEXAMETHASONE SODIUM PHOSPHATE 10 MG/ML IJ SOLN
INTRAMUSCULAR | Status: AC
  Filled 2016-09-03: qty 1

## 2016-09-03 MED ORDER — PROPOFOL 10 MG/ML IV BOLUS
INTRAVENOUS | Status: AC
  Filled 2016-09-03: qty 40

## 2016-09-03 MED ORDER — DEXAMETHASONE SODIUM PHOSPHATE 10 MG/ML IJ SOLN
10.0000 mg | Freq: Once | INTRAMUSCULAR | Status: AC
  Administered 2016-09-04: 08:00:00 10 mg via INTRAVENOUS
  Filled 2016-09-03: qty 1

## 2016-09-03 MED ORDER — PROPOFOL 500 MG/50ML IV EMUL
INTRAVENOUS | Status: DC | PRN
  Administered 2016-09-03: 40 ug/kg/min via INTRAVENOUS

## 2016-09-03 MED ORDER — BUPIVACAINE HCL (PF) 0.25 % IJ SOLN
INTRAMUSCULAR | Status: AC
  Filled 2016-09-03: qty 30

## 2016-09-03 MED ORDER — TRANEXAMIC ACID 1000 MG/10ML IV SOLN
1000.0000 mg | INTRAVENOUS | Status: AC
  Administered 2016-09-03: 1000 mg via INTRAVENOUS
  Filled 2016-09-03: qty 10

## 2016-09-03 MED ORDER — ONDANSETRON HCL 4 MG/2ML IJ SOLN
INTRAMUSCULAR | Status: DC | PRN
  Administered 2016-09-03: 4 mg via INTRAVENOUS

## 2016-09-03 MED ORDER — PROPOFOL 10 MG/ML IV BOLUS
INTRAVENOUS | Status: AC
  Filled 2016-09-03: qty 20

## 2016-09-03 MED ORDER — VANCOMYCIN HCL IN DEXTROSE 1-5 GM/200ML-% IV SOLN
1000.0000 mg | INTRAVENOUS | Status: AC
  Administered 2016-09-03: 1000 mg via INTRAVENOUS
  Filled 2016-09-03: qty 200

## 2016-09-03 MED ORDER — EPHEDRINE SULFATE-NACL 50-0.9 MG/10ML-% IV SOSY
PREFILLED_SYRINGE | INTRAVENOUS | Status: DC | PRN
  Administered 2016-09-03: 10 mg via INTRAVENOUS
  Administered 2016-09-03 (×2): 5 mg via INTRAVENOUS

## 2016-09-03 MED ORDER — ONDANSETRON HCL 4 MG/2ML IJ SOLN: 4.0000 mg | Freq: Four times a day (QID) | INTRAMUSCULAR | Status: DC | PRN

## 2016-09-03 MED ORDER — MORPHINE SULFATE (PF) 2 MG/ML IV SOLN: 1.0000 mg | INTRAVENOUS | Status: DC | PRN

## 2016-09-03 MED ORDER — HYDROMORPHONE HCL 1 MG/ML IJ SOLN: 0.2500 mg | INTRAMUSCULAR | Status: DC | PRN

## 2016-09-03 MED ORDER — FENTANYL CITRATE (PF) 100 MCG/2ML IJ SOLN
INTRAMUSCULAR | Status: DC | PRN
  Administered 2016-09-03: 50 ug via INTRAVENOUS

## 2016-09-03 MED ORDER — DEXAMETHASONE SODIUM PHOSPHATE 10 MG/ML IJ SOLN
10.0000 mg | Freq: Once | INTRAMUSCULAR | Status: AC
  Administered 2016-09-03: 10 mg via INTRAVENOUS

## 2016-09-03 MED ORDER — DIPHENHYDRAMINE HCL 12.5 MG/5ML PO ELIX: 12.5000 mg | ORAL_SOLUTION | ORAL | Status: DC | PRN

## 2016-09-03 MED ORDER — ACETAMINOPHEN 10 MG/ML IV SOLN
INTRAVENOUS | Status: AC
  Filled 2016-09-03: qty 100

## 2016-09-03 MED ORDER — LIDOCAINE 2% (20 MG/ML) 5 ML SYRINGE
INTRAMUSCULAR | Status: DC | PRN
  Administered 2016-09-03: 100 mg via INTRAVENOUS

## 2016-09-03 MED ORDER — ACETAMINOPHEN 650 MG RE SUPP: 650.0000 mg | Freq: Four times a day (QID) | RECTAL | Status: DC | PRN

## 2016-09-03 MED ORDER — ALBUTEROL SULFATE (2.5 MG/3ML) 0.083% IN NEBU: 3.0000 mL | INHALATION_SOLUTION | Freq: Four times a day (QID) | RESPIRATORY_TRACT | Status: DC | PRN

## 2016-09-03 MED ORDER — FLUTICASONE FUROATE-VILANTEROL 200-25 MCG/INH IN AEPB
1.0000 | INHALATION_SPRAY | Freq: Every day | RESPIRATORY_TRACT | Status: DC
  Filled 2016-09-03 (×2): qty 28

## 2016-09-03 MED ORDER — ACETAMINOPHEN 500 MG PO TABS
1000.0000 mg | ORAL_TABLET | Freq: Four times a day (QID) | ORAL | Status: DC
  Administered 2016-09-03 – 2016-09-04 (×3): 1000 mg via ORAL
  Filled 2016-09-03 (×3): qty 2

## 2016-09-03 MED ORDER — VANCOMYCIN HCL IN DEXTROSE 1-5 GM/200ML-% IV SOLN
1000.0000 mg | Freq: Once | INTRAVENOUS | Status: AC
  Administered 2016-09-04: 02:00:00 1000 mg via INTRAVENOUS
  Filled 2016-09-03: qty 200

## 2016-09-03 MED ORDER — BISACODYL 10 MG RE SUPP: 10.0000 mg | Freq: Every day | RECTAL | Status: DC | PRN

## 2016-09-03 MED ORDER — BUPIVACAINE HCL (PF) 0.25 % IJ SOLN
INTRAMUSCULAR | Status: DC | PRN
  Administered 2016-09-03: 30 mL

## 2016-09-03 MED ORDER — METHOCARBAMOL 500 MG PO TABS: 500.0000 mg | ORAL_TABLET | Freq: Four times a day (QID) | ORAL | Status: DC | PRN

## 2016-09-03 MED ORDER — STERILE WATER FOR IRRIGATION IR SOLN
Status: DC | PRN
  Administered 2016-09-03: 2000 mL

## 2016-09-03 MED ORDER — LACTATED RINGERS IV SOLN
INTRAVENOUS | Status: DC
  Administered 2016-09-03: 12:00:00 via INTRAVENOUS
  Administered 2016-09-03: 1000 mL via INTRAVENOUS

## 2016-09-03 MED ORDER — METOCLOPRAMIDE HCL 5 MG/ML IJ SOLN: 5.0000 mg | Freq: Three times a day (TID) | INTRAMUSCULAR | Status: DC | PRN

## 2016-09-03 MED ORDER — MIDAZOLAM HCL 5 MG/5ML IJ SOLN
INTRAMUSCULAR | Status: DC | PRN
  Administered 2016-09-03: 2 mg via INTRAVENOUS

## 2016-09-03 MED ORDER — TRANEXAMIC ACID 1000 MG/10ML IV SOLN
1000.0000 mg | Freq: Once | INTRAVENOUS | Status: AC
  Administered 2016-09-03: 1000 mg via INTRAVENOUS
  Filled 2016-09-03: qty 10

## 2016-09-03 MED ORDER — OXYCODONE HCL 5 MG PO TABS
5.0000 mg | ORAL_TABLET | ORAL | Status: DC | PRN
  Administered 2016-09-03 – 2016-09-04 (×2): 10 mg via ORAL
  Administered 2016-09-04: 08:00:00 5 mg via ORAL
  Filled 2016-09-03 (×2): qty 2
  Filled 2016-09-03: qty 1

## 2016-09-03 MED ORDER — CHLORHEXIDINE GLUCONATE 4 % EX LIQD: 60.0000 mL | Freq: Once | CUTANEOUS | Status: DC

## 2016-09-03 MED ORDER — RIVAROXABAN 10 MG PO TABS
10.0000 mg | ORAL_TABLET | Freq: Every day | ORAL | Status: DC
  Administered 2016-09-04: 08:00:00 10 mg via ORAL
  Filled 2016-09-03: qty 1

## 2016-09-03 MED ORDER — BUPIVACAINE IN DEXTROSE 0.75-8.25 % IT SOLN
INTRATHECAL | Status: DC | PRN
  Administered 2016-09-03: 2 mL via INTRATHECAL

## 2016-09-03 SURGICAL SUPPLY — 33 items
BAG DECANTER FOR FLEXI CONT (MISCELLANEOUS) IMPLANT
BAG ZIPLOCK 12X15 (MISCELLANEOUS) ×3 IMPLANT
BLADE SAG 18X100X1.27 (BLADE) ×3 IMPLANT
CAPT HIP TOTAL 2 ×3 IMPLANT
CLOSURE WOUND 1/2 X4 (GAUZE/BANDAGES/DRESSINGS) ×1
CLOTH BEACON ORANGE TIMEOUT ST (SAFETY) ×3 IMPLANT
COVER PERINEAL POST (MISCELLANEOUS) ×3 IMPLANT
DECANTER SPIKE VIAL GLASS SM (MISCELLANEOUS) ×3 IMPLANT
DRAPE STERI IOBAN 125X83 (DRAPES) ×3 IMPLANT
DRAPE U-SHAPE 47X51 STRL (DRAPES) ×6 IMPLANT
DRSG ADAPTIC 3X8 NADH LF (GAUZE/BANDAGES/DRESSINGS) ×3 IMPLANT
DRSG MEPILEX BORDER 4X4 (GAUZE/BANDAGES/DRESSINGS) ×3 IMPLANT
DRSG MEPILEX BORDER 4X8 (GAUZE/BANDAGES/DRESSINGS) ×3 IMPLANT
DURAPREP 26ML APPLICATOR (WOUND CARE) ×3 IMPLANT
ELECT REM PT RETURN 9FT ADLT (ELECTROSURGICAL) ×3
ELECTRODE REM PT RTRN 9FT ADLT (ELECTROSURGICAL) ×1 IMPLANT
EVACUATOR 1/8 PVC DRAIN (DRAIN) ×3 IMPLANT
GLOVE BIO SURGEON STRL SZ7.5 (GLOVE) ×3 IMPLANT
GLOVE BIO SURGEON STRL SZ8 (GLOVE) ×3 IMPLANT
GLOVE BIOGEL PI IND STRL 8 (GLOVE) ×2 IMPLANT
GLOVE BIOGEL PI INDICATOR 8 (GLOVE) ×4
GOWN STRL REUS W/TWL LRG LVL3 (GOWN DISPOSABLE) ×3 IMPLANT
GOWN STRL REUS W/TWL XL LVL3 (GOWN DISPOSABLE) ×3 IMPLANT
PACK ANTERIOR HIP CUSTOM (KITS) ×3 IMPLANT
STRIP CLOSURE SKIN 1/2X4 (GAUZE/BANDAGES/DRESSINGS) ×2 IMPLANT
SUT ETHIBOND NAB CT1 #1 30IN (SUTURE) ×3 IMPLANT
SUT MNCRL AB 4-0 PS2 18 (SUTURE) ×3 IMPLANT
SUT VIC AB 2-0 CT1 27 (SUTURE) ×4
SUT VIC AB 2-0 CT1 TAPERPNT 27 (SUTURE) ×2 IMPLANT
SUT VLOC 180 0 24IN GS25 (SUTURE) ×3 IMPLANT
SYR 50ML LL SCALE MARK (SYRINGE) IMPLANT
TRAY FOLEY W/METER SILVER 16FR (SET/KITS/TRAYS/PACK) ×3 IMPLANT
YANKAUER SUCT BULB TIP 10FT TU (MISCELLANEOUS) ×3 IMPLANT

## 2016-09-03 NOTE — Transfer of Care (Signed)
Immediate Anesthesia Transfer of Care Note  Patient: Dennis Lozano  Procedure(s) Performed: Procedure(s): LEFT TOTAL HIP ARTHROPLASTY ANTERIOR APPROACH (Left)  Patient Location: PACU  Anesthesia Type:MAC and Spinal  Level of Consciousness: awake, alert , oriented and patient cooperative  Airway & Oxygen Therapy: Patient Spontanous Breathing and Patient connected to face mask oxygen  Post-op Assessment: Report given to RN and Post -op Vital signs reviewed and stable  Post vital signs: Reviewed and stable  Last Vitals:  Vitals:   09/03/16 1107  BP: 138/84  Pulse: 77  Resp: 16  Temp: 36.7 C    Last Pain:  Vitals:   09/03/16 1134  TempSrc:   PainSc: 3       Patients Stated Pain Goal: 4 (09/03/16 1134)  Complications: No apparent anesthesia complications

## 2016-09-03 NOTE — Anesthesia Postprocedure Evaluation (Signed)
Anesthesia Post Note  Patient: Dennis Lozano  Procedure(s) Performed: Procedure(s) (LRB): LEFT TOTAL HIP ARTHROPLASTY ANTERIOR APPROACH (Left)  Patient location during evaluation: PACU Anesthesia Type: Spinal and MAC Level of consciousness: awake and alert Pain management: pain level controlled Vital Signs Assessment: post-procedure vital signs reviewed and stable Respiratory status: spontaneous breathing, respiratory function stable and patient connected to nasal cannula oxygen Cardiovascular status: blood pressure returned to baseline and stable Postop Assessment: spinal receding Anesthetic complications: no    Last Vitals:  Vitals:   09/03/16 1700 09/03/16 1715  BP: 128/80 122/77  Pulse: 72 68  Resp:    Temp:  36.5 C    Last Pain:  Vitals:   09/03/16 1715  TempSrc:   PainSc: 0-No pain    LLE Motor Response: Purposeful movement (09/03/16 1715) LLE Sensation: Decreased (09/03/16 1715) RLE Motor Response: Purposeful movement (09/03/16 1715) RLE Sensation: Decreased (09/03/16 1715) L Sensory Level: L3-Anterior knee, lower leg (09/03/16 1715) R Sensory Level: L3-Anterior knee, lower leg (09/03/16 1715)  Ellason Segar,W. EDMOND

## 2016-09-03 NOTE — Anesthesia Preprocedure Evaluation (Addendum)
Anesthesia Evaluation  Patient identified by MRN, date of birth, ID band Patient awake    Reviewed: Allergy & Precautions, H&P , NPO status , Patient's Chart, lab work & pertinent test results  Airway Mallampati: II  TM Distance: >3 FB Neck ROM: Full    Dental no notable dental hx. (+) Teeth Intact, Dental Advisory Given   Pulmonary asthma ,    Pulmonary exam normal breath sounds clear to auscultation       Cardiovascular negative cardio ROS   Rhythm:Regular Rate:Normal     Neuro/Psych negative neurological ROS  negative psych ROS   GI/Hepatic Neg liver ROS, GERD  Medicated and Controlled,  Endo/Other  negative endocrine ROS  Renal/GU negative Renal ROS  negative genitourinary   Musculoskeletal  (+) Arthritis , Rheumatoid disorders,    Abdominal   Peds  Hematology negative hematology ROS (+)   Anesthesia Other Findings   Reproductive/Obstetrics negative OB ROS                            Anesthesia Physical Anesthesia Plan  ASA: II  Anesthesia Plan: Spinal   Post-op Pain Management:    Induction: Intravenous  Airway Management Planned: Simple Face Mask  Additional Equipment:   Intra-op Plan:   Post-operative Plan:   Informed Consent: I have reviewed the patients History and Physical, chart, labs and discussed the procedure including the risks, benefits and alternatives for the proposed anesthesia with the patient or authorized representative who has indicated his/her understanding and acceptance.   Dental advisory given  Plan Discussed with: CRNA  Anesthesia Plan Comments:         Anesthesia Quick Evaluation

## 2016-09-03 NOTE — Anesthesia Procedure Notes (Signed)
Spinal  Patient location during procedure: OR Start time: 09/03/2016 2:37 PM End time: 09/03/2016 2:42 PM Staffing Resident/CRNA: Carleene Cooper A Preanesthetic Checklist Completed: patient identified, site marked, surgical consent, pre-op evaluation, timeout performed, IV checked, risks and benefits discussed and monitors and equipment checked Spinal Block Patient position: sitting Prep: DuraPrep Patient monitoring: heart rate, continuous pulse ox and blood pressure Approach: midline Location: L3-4 Injection technique: single-shot Needle Needle type: Sprotte  Needle gauge: 24 G Needle length: 9 cm Assessment Sensory level: T4 Additional Notes Pt placed in sitting position. Tolerated procedure well. Spinal kit expiration dated checked and verified. + CSF, - heme

## 2016-09-03 NOTE — Op Note (Signed)
OPERATIVE REPORT- TOTAL HIP ARTHROPLASTY   PREOPERATIVE DIAGNOSIS: Osteoarthritis of the Left hip.   POSTOPERATIVE DIAGNOSIS: Osteoarthritis of the Left  hip.   PROCEDURE: Left total hip arthroplasty, anterior approach.   SURGEON: Ollen GrossFrank Shara Hartis, MD   ASSISTANT: Avel Peacerew Perkins, PA-C  ANESTHESIA:  Spinal  ESTIMATED BLOOD LOSS:-550 ml    DRAINS: Hemovac x1.   COMPLICATIONS: None   CONDITION: PACU - hemodynamically stable.   BRIEF CLINICAL NOTE: Dennis Lozano is a 60 y.o. male who has advanced end-  stage arthritis of their Left  hip with progressively worsening pain and  dysfunction.The patient has failed nonoperative management and presents for  total hip arthroplasty.   PROCEDURE IN DETAIL: After successful administration of spinal  anesthetic, the traction boots for the [facility] were placed on both  feet and the patient was [facility], boots placed into the leg  holders. The Left hip was then isolated from the perineum with plastic  drapes and prepped and draped in the usual sterile fashion. ASIS and  greater trochanter were marked and a oblique incision was made, starting  at about 1 cm lateral and 2 cm distal to the ASIS and coursing towards  the anterior cortex of the femur. The skin was cut with a 10 blade  through subcutaneous tissue to the level of the fascia overlying the  tensor fascia lata muscle. The fascia was then incised in line with the  incision at the junction of the anterior third and posterior 2/3rd. The  muscle was teased off the fascia and then the interval between the TFL  and the rectus was developed. The Hohmann retractor was then placed at  the top of the femoral neck over the capsule. The vessels overlying the  capsule were cauterized and the fat on top of the capsule was removed.  A Hohmann retractor was then placed anterior underneath the rectus  femoris to give exposure to the entire anterior capsule. A T-shaped   capsulotomy was performed. The edges were tagged and the femoral head  was identified.       Osteophytes are removed off the superior acetabulum.  The femoral neck was then cut in situ with an oscillating saw. Traction  was then applied to the left lower extremity utilizing the [facility]  traction. The femoral head was then removed. Retractors were placed  around the acetabulum and then circumferential removal of the labrum was  performed. Osteophytes were also removed. Reaming starts at 49 mm to  medialize and  Increased in 2 mm increments to 53 mm. We reamed in  approximately 40 degrees of abduction, 20 degrees anteversion. A 54 mm  pinnacle acetabular shell was then impacted in anatomic position under  fluoroscopic guidance with excellent purchase. We did not need to place  any additional dome screws. A 36 mm neutral + 4 marathon liner was then  placed into the acetabular shell.       The femoral lift was then placed along the lateral aspect of the femur  just distal to the vastus ridge. The leg was  externally rotated and capsule  was stripped off the inferior aspect of the femoral neck down to the  level of the lesser trochanter, this was done with electrocautery. The femur was lifted after this was performed. The  leg was then placed in an extended and adducted position essentially delivering the femur. We also removed the capsule superiorly and the piriformis from the piriformis  fossa to gain excellent exposure of the  proximal femur. Rongeur was used to remove some cancellous bone to get  into the lateral portion of the proximal femur for placement of the  initial starter reamer. The starter broaches was placed  the starter broach  and was shown to go down the center of the canal. Broaching  with the  Corail system was then performed starting at size 8, coursing  Up to size 13. A size 13 had excellent torsional and rotational  and axial stability. The trial standard offset neck was then  placed  with a 36 + 9 trial head. The hip was then reduced. We confirmed that  the stem was in the canal both on AP and lateral x-rays. It also has excellent sizing. The hip was reduced with outstanding stability through full extension and full external rotation.. AP pelvis was taken and the leg lengths were measured and found to be equal. Hip was then dislocated again and the femoral head and neck removed. The  femoral broach was removed. Size 13 Corail stem with a standard offset  neck was then impacted into the femur following native anteversion. Has  excellent purchase in the canal. Excellent torsional and rotational and  axial stability. It is confirmed to be in the canal on AP and lateral  fluoroscopic views. The 36 + 9 ceramic head was placed and the hip  reduced with outstanding stability. Again AP pelvis was taken and it  confirmed that the leg lengths were equal. The wound was then copiously  irrigated with saline solution and the capsule reattached and repaired  with Ethibond suture. 30 ml of .25% Bupivicaine was  injected into the capsule and into the edge of the tensor fascia lata as well as subcutaneous tissue. The fascia overlying the tensor fascia lata was then closed with a running #1 V-Loc. Subcu was closed with interrupted 2-0 Vicryl and subcuticular running 4-0 Monocryl. Incision was cleaned  and dried. Steri-Strips and a bulky sterile dressing applied. Hemovac  drain was hooked to suction and then the patient was awakened and transported to  recovery in stable condition.        Please note that a surgical assistant was a medical necessity for this procedure to perform it in a safe and expeditious manner. Assistant was necessary to provide appropriate retraction of vital neurovascular structures and to prevent femoral fracture and allow for anatomic placement of the prosthesis.  Ollen GrossFrank Beata Beason, M.D.

## 2016-09-03 NOTE — H&P (View-Only) (Signed)
Dennis Lozano DOB: 24-Jul-1956 Undefined / Language: [place] / Race: White Male Date of Admission:  09/03/2016 CC:  Left hip pain History of Present Illness The patient is a 60 year old male who comes in for a preoperative History and Physical. The patient is scheduled for a left total hip arthroplasty (anterior) to be performed [facility] Dr[facility] Gus RankinFrank V. Aluisio[facility]. The patient is a 60 year old male who presented with a hip problem. The patient reports left hip problems including pain (weightbearing), grinding and giving way symptoms that have been present for 2 year(s). The symptoms began without any known injury. Symptoms reported include hip pain, weakness, pain with weightbearing and grinding The patient reports symptoms radiating to the: left groin. The patient describes the hip problem as sharp and throbbing. The symptoms are described as severe.The patient feels as if their symptoms are does feel they are worsening. Current treatment includes nonsteroidal anti-inflammatory drugs. Previous workup for this problem has included pelvic x-rays and hip x-rays. 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 He has been told he has arthritis in the hip and at some point he need a hip replacement. He states that the left hip is hurting him at all times now. It is limiting what he can and cannot do. He would like to do more activity, but the hip is now preventing him from doing so. Problems has been going on for quite a while much worse in the past six months to one year. It is now starting to bother him at night also. He is not having any current pain with his right hip. The left hip pain starts in the groin and will radiate down to his knee. He definitely notices limitations in motion. It is getting harder to do a lot of the activities he desires. AP pelvis and lateral of the left hip show that he has advanced end stage arthritis of that left hip, bone on bone with  significant osteophyte formation, subchondral cystic formation. He does have some degeneration on the right, but very mild compared to the left. He has got advanced end stage arthritis of the left hip with progressive pain and dysfunction. At this point, the most predictable means of long term improvement in pain and function will be total hip arthroplasty. He is ready to proceed. They have been treated conservatively in the past for the above stated problem and despite conservative measures, they continue to have progressive pain and severe functional limitations and dysfunction. They have failed non-operative management including home exercise, medications. It is felt that they would benefit from undergoing total joint replacement. Risks and benefits of the procedure have been discussed with the patient and they elect to proceed with surgery. There are no active contraindications to surgery such as ongoing infection or rapidly progressive neurological disease.  Problem List/Past Medical Primary osteoarthritis of left hip (M16.12)  Gastroesophageal Reflux Disease  Rheumatoid Arthritis  Asthma  Mild Shingles  Past History - 08/2007  Allergies Penicillins   Family History Rheumatoid Arthritis  Mother.  Social History Current drinker  11/24/2015: Currently drinks beer only occasionally per week Current work status  working full time Exercise  Exercises weekly; does running / walking No history of drug/alcohol rehab  Not under pain contract  Number of flights of stairs before winded  4-5 Tobacco use  Never smoker. 11/24/2015  Medication History Enbrel SureClick (50MG /ML Soln Auto-inj, Subcutaneous) Active. ProAir HFA (108 (90 Base)MCG/ACT  Aerosol Soln, Inhalation) Active. Singulair (10MG  Tablet, Oral) Active. Prilosec Active. Tums Active.  Past Surgical History Appendectomy    Review of Systems General Not Present- Chills, Fatigue, Fever, Memory Loss, Night  Sweats, Weight Gain and Weight Loss. Skin Not Present- Eczema, Hives, Itching, Lesions and Rash. HEENT Not Present- Dentures, Double Vision, Headache, Hearing Loss, Tinnitus and Visual Loss. Respiratory Not Present- Allergies, Chronic Cough, Coughing up blood, Shortness of breath at rest and Shortness of breath with exertion. Cardiovascular Not Present- Chest Pain, Difficulty Breathing Lying Down, Murmur, Palpitations, Racing/skipping heartbeats and Swelling. Gastrointestinal Not Present- Abdominal Pain, Bloody Stool, Constipation, Diarrhea, Difficulty Swallowing, Heartburn, Jaundice, Loss of appetitie, Nausea and Vomiting. Male Genitourinary Not Present- Blood in Urine, Discharge, Flank Pain, Incontinence, Painful Urination, Urgency, Urinary frequency, Urinary Retention, Urinating at Night and Weak urinary stream. Musculoskeletal Present- Joint Pain. Not Present- Back Pain, Joint Swelling, Morning Stiffness, Muscle Pain, Muscle Weakness and Spasms. Neurological Not Present- Blackout spells, Difficulty with balance, Dizziness, Paralysis, Tremor and Weakness. Psychiatric Not Present- Insomnia.  Vitals Weight: 190 lb Height: 69in Weight was reported by patient. Height was reported by patient. Body Surface Area: 2.02 m Body Mass Index: 28.06 kg/m  Pulse: 76 (Regular)  BP: 132/80 (Sitting, Right Arm, Standard)  Physical Exam General Mental Status -Alert, cooperative and good historian. General Appearance-pleasant, Not in acute distress. Orientation-Oriented X3. Build & Nutrition-Well nourished and Well developed.  Head and Neck Head-normocephalic, atraumatic . Neck Global Assessment - supple, no bruit auscultated on the right, no bruit auscultated on the left.  Eye Pupil - Bilateral-Regular and Round. Motion - Bilateral-EOMI.  Chest and Lung Exam Auscultation Breath sounds - clear at anterior chest wall and clear at posterior chest wall. Adventitious sounds -  No Adventitious sounds.  Cardiovascular Auscultation Rhythm - Regular rate and rhythm. Heart Sounds - S1 WNL and S2 WNL. Murmurs & Other Heart Sounds - Auscultation of the heart reveals - No Murmurs.  Abdomen Palpation/Percussion Tenderness - Abdomen is non-tender to palpation. Rigidity (guarding) - Abdomen is soft. Auscultation Auscultation of the abdomen reveals - Bowel sounds normal.  Male Genitourinary Note: Not done, not pertinent to present illness   Musculoskeletal Note: On exam, he is a well-developed male, alert and oriented, no apparent distress. Evaluation of his right hip flexion to about 110, rotation in 10, out 30, abduction 30 with slight discomfort. His left hip has flexion to 100, no internal rotation, only about 10 degrees of external rotation, 10 degrees of abduction. He does have pain on range of motion. His knee exam is normal bilaterally. Pulse, sensation, motor intact throughout.  RADIOGRAPHS AP pelvis and lateral of the left hip show that he has advanced end stage arthritis of that left hip, bone on bone with significant osteophyte formation, subchondral cystic formation. He does have some degeneration on the right, but very mild compared to the left.   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 Merri BrunetteWalter Pharr  IV TXA  Signed electronically by Beckey RutterAlezandrew L Arryana Tolleson, III PA-C

## 2016-09-03 NOTE — Interval H&P Note (Signed)
History and Physical Interval Note:  09/03/2016 1:56 PM  Dennis Lozano  has presented today for surgery, with the diagnosis of LEFT HIP OA  The various methods of treatment have been discussed with the patient and family. After consideration of risks, benefits and other options for treatment, the patient has consented to  Procedure(s): LEFT TOTAL HIP ARTHROPLASTY ANTERIOR APPROACH (Left) as a surgical intervention .  The patient's history has been reviewed, patient examined, no change in status, stable for surgery.  I have reviewed the patient's chart and labs.  Questions were answered to the patient's satisfaction.     Loanne DrillingALUISIO,Aliahna Statzer V

## 2016-09-04 ENCOUNTER — Encounter (HOSPITAL_COMMUNITY): Payer: Self-pay | Admitting: Orthopedic Surgery

## 2016-09-04 LAB — BASIC METABOLIC PANEL
ANION GAP: 6 (ref 5–15)
BUN: 13 mg/dL (ref 6–20)
CALCIUM: 8.4 mg/dL — AB (ref 8.9–10.3)
CHLORIDE: 102 mmol/L (ref 101–111)
CO2: 25 mmol/L (ref 22–32)
Creatinine, Ser: 0.96 mg/dL (ref 0.61–1.24)
GFR calc non Af Amer: >60 mL/min (ref >60)
Glucose, Bld: 163 mg/dL — ABNORMAL HIGH (ref 65–99)
Potassium: 4.1 mmol/L (ref 3.5–5.1)
Sodium: 133 mmol/L — ABNORMAL LOW (ref 135–145)

## 2016-09-04 LAB — CBC
HEMATOCRIT: 37 % — AB (ref 39.0–52.0)
HEMOGLOBIN: 13.1 g/dL (ref 13.0–17.0)
MCH: 30.8 pg (ref 26.0–34.0)
MCHC: 35.4 g/dL (ref 30.0–36.0)
MCV: 86.9 fL (ref 78.0–100.0)
Platelets: 185 10*3/uL (ref 150–400)
RBC: 4.26 MIL/uL (ref 4.22–5.81)
RDW: 12.8 % (ref 11.5–15.5)
WBC: 14.3 10*3/uL — AB (ref 4.0–10.5)

## 2016-09-04 MED ORDER — RIVAROXABAN 10 MG PO TABS
10.0000 mg | ORAL_TABLET | Freq: Every day | ORAL | 0 refills | Status: DC
Start: 2016-09-05 — End: 2018-02-04

## 2016-09-04 MED ORDER — TRAMADOL HCL 50 MG PO TABS: 50.0000 mg | ORAL_TABLET | Freq: Four times a day (QID) | ORAL | 1 refills | Status: DC | PRN

## 2016-09-04 MED ORDER — SODIUM CHLORIDE 0.9 % IV BOLUS (SEPSIS)
250.0000 mL | Freq: Once | INTRAVENOUS | Status: AC
  Administered 2016-09-04: 08:00:00 250 mL via INTRAVENOUS

## 2016-09-04 MED ORDER — METHOCARBAMOL 500 MG PO TABS: 500.0000 mg | ORAL_TABLET | Freq: Four times a day (QID) | ORAL | 0 refills | Status: DC | PRN

## 2016-09-04 MED ORDER — OXYCODONE HCL 5 MG PO TABS: 5.0000 mg | ORAL_TABLET | ORAL | 0 refills | Status: DC | PRN

## 2016-09-04 NOTE — Discharge Summary (Signed)
Physician Discharge Summary   Patient ID: Dennis Lozano MRN: [id number] DOB/AGE: Feb 09, 1956 60 y.o.  Admit date: 09/03/2016 Discharge date: 09/04/2016  Primary Diagnosis:  Osteoarthritis of the Left hip.  Admission Diagnoses:  Past Medical History:  Diagnosis Date  . Arthritis   . Asthma    slight   . GERD (gastroesophageal reflux disease)    occasion   . Hyperlipidemia   . Rheumatoid arthritis(714.0)   . Vitamin D deficiency    Discharge Diagnoses:   Principal Problem:   OA (osteoarthritis) of hip  Estimated body mass index is 29.24 kg/m as calculated from the following:   Height as of this encounter: 5' 9"  (1.753 m).   Weight as of this encounter: 89.8 kg (198 lb).  Procedure(s) (LRB): LEFT TOTAL HIP ARTHROPLASTY ANTERIOR APPROACH (Left)   Consults: None  HPI: Dennis Lozano is a 60 y.o. male who has advanced end-  stage arthritis of their Left  hip with progressively worsening pain and  dysfunction.The patient has failed nonoperative management and presents for  total hip arthroplasty.  Laboratory Data: Admission on 09/03/2016  Component Date Value Ref Range Status  . WBC 09/04/2016 14.3* 4.0 - 10.5 K/uL Final  . RBC 09/04/2016 4.26  4.22 - 5.81 MIL/uL Final  . Hemoglobin 09/04/2016 13.1  13.0 - 17.0 g/dL Final  . HCT 09/04/2016 37.0* 39.0 - 52.0 % Final  . MCV 09/04/2016 86.9  78.0 - 100.0 fL Final  . MCH 09/04/2016 30.8  26.0 - 34.0 pg Final  . MCHC 09/04/2016 35.4  30.0 - 36.0 g/dL Final  . RDW 09/04/2016 12.8  11.5 - 15.5 % Final  . Platelets 09/04/2016 185  150 - 400 K/uL Final  . Sodium 09/04/2016 133* 135 - 145 mmol/L Final  . Potassium 09/04/2016 4.1  3.5 - 5.1 mmol/L Final  . Chloride 09/04/2016 102  101 - 111 mmol/L Final  . CO2 09/04/2016 25  22 - 32 mmol/L Final  . Glucose, Bld 09/04/2016 163* 65 - 99 mg/dL Final  . BUN 09/04/2016 13  6 - 20 mg/dL Final  . Creatinine, Ser 09/04/2016 0.96  0.61 - 1.24 mg/dL Final  . Calcium 09/04/2016 8.4* 8.9  - 10.3 mg/dL Final  . GFR calc non Af Amer 09/04/2016 >60  >60 mL/min Final  . GFR calc Af Amer 09/04/2016 >60  >60 mL/min Final   Comment: (NOTE) The eGFR has been calculated using the CKD EPI equation. This calculation has not been validated in all clinical situations. eGFR's persistently <60 mL/min signify possible Chronic Kidney Disease.   Georgiann Hahn gap 09/04/2016 6  5 - 15 Final  Hospital Outpatient Visit on 08/28/2016  Component Date Value Ref Range Status  . aPTT 08/28/2016 25  24 - 36 seconds Final  . WBC 08/28/2016 7.8  4.0 - 10.5 K/uL Final  . RBC 08/28/2016 4.88  4.22 - 5.81 MIL/uL Final  . Hemoglobin 08/28/2016 14.9  13.0 - 17.0 g/dL Final  . HCT 08/28/2016 42.9  39.0 - 52.0 % Final  . MCV 08/28/2016 87.9  78.0 - 100.0 fL Final  . MCH 08/28/2016 30.5  26.0 - 34.0 pg Final  . MCHC 08/28/2016 34.7  30.0 - 36.0 g/dL Final  . RDW 08/28/2016 12.9  11.5 - 15.5 % Final  . Platelets 08/28/2016 206  150 - 400 K/uL Final  . Sodium 08/28/2016 137  135 - 145 mmol/L Final  . Potassium 08/28/2016 3.7  3.5 - 5.1 mmol/L Final  . Chloride  08/28/2016 102  101 - 111 mmol/L Final  . CO2 08/28/2016 28  22 - 32 mmol/L Final  . Glucose, Bld 08/28/2016 84  65 - 99 mg/dL Final  . BUN 08/28/2016 15  6 - 20 mg/dL Final  . Creatinine, Ser 08/28/2016 1.13  0.61 - 1.24 mg/dL Final  . Calcium 08/28/2016 9.0  8.9 - 10.3 mg/dL Final  . Total Protein 08/28/2016 7.4  6.5 - 8.1 g/dL Final  . Albumin 08/28/2016 4.1  3.5 - 5.0 g/dL Final  . AST 08/28/2016 26  15 - 41 U/L Final  . ALT 08/28/2016 22  17 - 63 U/L Final  . Alkaline Phosphatase 08/28/2016 62  38 - 126 U/L Final  . Total Bilirubin 08/28/2016 1.3* 0.3 - 1.2 mg/dL Final  . GFR calc non Af Amer 08/28/2016 >60  >60 mL/min Final  . GFR calc Af Amer 08/28/2016 >60  >60 mL/min Final   Comment: (NOTE) The eGFR has been calculated using the CKD EPI equation. This calculation has not been validated in all clinical situations. eGFR's persistently <60  mL/min signify possible Chronic Kidney Disease.   . Anion gap 08/28/2016 7  5 - 15 Final  . Prothrombin Time 08/28/2016 12.9  11.4 - 15.2 seconds Final  . INR 08/28/2016 0.97   Final  . ABO/RH(D) 09/03/2016 B POS   Final  . Antibody Screen 09/03/2016 NEG   Final  . Sample Expiration 09/03/2016 09/06/2016   Final  . Extend sample reason 09/03/2016 NO TRANSFUSIONS OR PREGNANCY IN THE PAST 3 MONTHS   Final  . Color, Urine 08/28/2016 YELLOW  YELLOW Final  . APPearance 08/28/2016 CLEAR  CLEAR Final  . Specific Gravity, Urine 08/28/2016 1.012  1.005 - 1.030 Final  . pH 08/28/2016 6.0  5.0 - 8.0 Final  . Glucose, UA 08/28/2016 NEGATIVE  NEGATIVE mg/dL Final  . Hgb urine dipstick 08/28/2016 SMALL* NEGATIVE Final  . Bilirubin Urine 08/28/2016 NEGATIVE  NEGATIVE Final  . Ketones, ur 08/28/2016 NEGATIVE  NEGATIVE mg/dL Final  . Protein, ur 08/28/2016 NEGATIVE  NEGATIVE mg/dL Final  . Nitrite 08/28/2016 NEGATIVE  NEGATIVE Final  . Leukocytes, UA 08/28/2016 NEGATIVE  NEGATIVE Final  . RBC / HPF 08/28/2016 0-5  0 - 5 RBC/hpf Final  . WBC, UA 08/28/2016 NONE SEEN  0 - 5 WBC/hpf Final  . Bacteria, UA 08/28/2016 NONE SEEN  NONE SEEN Final  . Squamous Epithelial / LPF 08/28/2016 NONE SEEN  NONE SEEN Final  . MRSA, PCR 08/28/2016 NEGATIVE  NEGATIVE Final  . Staphylococcus aureus 08/28/2016 POSITIVE* NEGATIVE Final   Comment:        The Xpert SA Assay (FDA approved for NASAL specimens in patients over 42 years of age), is one component of a comprehensive surveillance program.  Test performance has been validated by [facility] for patients greater than or equal to 7 year old. It is not intended to diagnose infection nor to guide or monitor treatment.   . ABO/RH(D) 08/28/2016 B POS   Final     X-Rays:Dg Pelvis Portable  Result Date: 09/03/2016 CLINICAL DATA:  Status post left hip arthroplasty. EXAM: PORTABLE PELVIS 1-2 VIEWS COMPARISON:  09/28/2013 FINDINGS: Interval left hip arthroplasty.  The hardware components are in anatomic alignment and there are no complicating features identified. No periprosthetic fracture or subluxation identified. There is a surgical drain which overlies the greater trochanter. IMPRESSION: 1. Status post left hip arthroplasty. 2. No complicating features identified. Electronically Signed   By: Queen Slough.D.  On: 09/03/2016 16:54   Dg C-arm 1-60 Min-no Report  Result Date: 09/03/2016 There is no Radiologist interpretation  for this exam.   EKG: Orders placed or performed in visit on 03/12/14  . EKG     Hospital Course: Patient was admitted to [facility] and taken to the OR and underwent the above state procedure without complications.  Patient tolerated the procedure well and was later transferred to the recovery room and then to the orthopaedic floor for postoperative care.  They were given PO and IV analgesics for pain control following their surgery.  They were given 24 hours of postoperative antibiotics of  Anti-infectives    Start     Dose/Rate Route Frequency Ordered Stop   09/04/16 0200  vancomycin (VANCOCIN) IVPB 1000 mg/200 mL premix     1,000 mg 200 mL/hr over 60 Minutes Intravenous  Once 09/03/16 1824 09/04/16 0309   09/03/16 2100  ceFAZolin (ANCEF) IVPB 2g/100 mL premix  Status:  Discontinued     2 g 200 mL/hr over 30 Minutes Intravenous Every 6 hours 09/03/16 1742 09/03/16 1825   09/03/16 1103  vancomycin (VANCOCIN) IVPB 1000 mg/200 mL premix     1,000 mg 200 mL/hr over 60 Minutes Intravenous On call to O.R. 09/03/16 1103 09/03/16 1504     and started on DVT prophylaxis in the form of Xarelto.   PT and OT were ordered for total hip protocol.  The patient was allowed to be WBAT with therapy. Discharge planning was consulted to help with postop disposition and equipment needs.  Patient had a good night on the evening of surgery.  They started to get up OOB with therapy on day one.  Hemovac drain was pulled without  difficulty.  Dressing was checked and  was clean and dry.Patient was seen in rounds and was ready to go home on POD 1  Discharge home with home health Diet - Cardiac diet Follow up - in 2 weeks Activity - WBAT Disposition - Home Condition Upon Discharge - improving D/C Meds - See DC Summary DVT Prophylaxis - Xarelto   Discharge Instructions    Call MD / Call 911    Complete by:  As directed    If you experience chest pain or shortness of breath, CALL 911 and be transported to the hospital emergency room.  If you develope a fever above 101 F, pus (white drainage) or increased drainage or redness at the wound, or calf pain, call your surgeon's office.   Change dressing    Complete by:  As directed    You may change your dressing dressing daily with sterile 4 x 4 inch gauze dressing and paper tape.  Do not submerge the incision under water.   Constipation Prevention    Complete by:  As directed    Drink plenty of fluids.  Prune juice may be helpful.  You may use a stool softener, such as Colace (over the counter) 100 mg twice a day.  Use MiraLax (over the counter) for constipation as needed.   Diet - low sodium heart healthy    Complete by:  As directed    Discharge instructions    Complete by:  As directed    Pick up stool softner and laxative for home use following surgery while on pain medications. Do not submerge incision under water. Please use good hand washing techniques while changing dressing each day. May shower starting three days after surgery. Please use a clean towel to pat  the incision dry following showers. Continue to use ice for pain and swelling after surgery. Do not use any lotions or creams on the incision until instructed by your surgeon.   Postoperative Constipation Protocol  Constipation - defined medically as fewer than three stools per week and severe constipation as less than one stool per week.  One of the most common issues patients have following  surgery is constipation.  Even if you have a regular bowel pattern at home, your normal regimen is likely to be disrupted due to multiple reasons following surgery.  Combination of anesthesia, postoperative narcotics, change in appetite and fluid intake all can affect your bowels.  In order to avoid complications following surgery, here are some recommendations in order to help you during your recovery period.  Colace (docusate) - Pick up an over-the-counter form of Colace or another stool softener and take twice a day as long as you are requiring postoperative pain medications.  Take with a full glass of water daily.  If you experience loose stools or diarrhea, hold the colace until you stool forms back up.  If your symptoms do not get better within 1 week or if they get worse, check with your doctor.  Dulcolax (bisacodyl) - Pick up over-the-counter and take as directed by the product packaging as needed to assist with the movement of your bowels.  Take with a full glass of water.  Use this product as needed if not relieved by Colace only.   MiraLax (polyethylene glycol) - Pick up over-the-counter to have on hand.  MiraLax is a solution that will increase the amount of water in your bowels to assist with bowel movements.  Take as directed and can mix with a glass of water, juice, soda, coffee, or tea.  Take if you go more than two days without a movement. Do not use MiraLax more than once per day. Call your doctor if you are still constipated or irregular after using this medication for 7 days in a row.  If you continue to have problems with postoperative constipation, please contact the office for further assistance and recommendations.  If you experience "the worst abdominal pain ever" or develop nausea or vomiting, please contact the office immediatly for further recommendations for treatment.   Take Xarelto for two and a half more weeks, then discontinue Xarelto. Once the patient has completed the  blood thinner regimen, then take a Baby 81 mg Aspirin daily for three more weeks.   Do not sit on low chairs, stoools or toilet seats, as it may be difficult to get up from low surfaces    Complete by:  As directed    Driving restrictions    Complete by:  As directed    No driving until released by the physician.   Increase activity slowly as tolerated    Complete by:  As directed    Lifting restrictions    Complete by:  As directed    No lifting until released by the physician.   Patient may shower    Complete by:  As directed    You may shower without a dressing once there is no drainage.  Do not wash over the wound.  If drainage remains, do not shower until drainage stops.   TED hose    Complete by:  As directed    Use stockings (TED hose) for 3 weeks on both leg(s).  You may remove them at night for sleeping.   Weight bearing as tolerated  Complete by:  As directed    Laterality:  left   Extremity:  Lower       Medication List    STOP taking these medications   ciprofloxacin 750 MG tablet Commonly known as:  CIPRO   etanercept 25 MG injection Commonly known as:  ENBREL     TAKE these medications   acetaminophen 500 MG tablet Commonly known as:  TYLENOL Take 1,000-1,500 mg by mouth 2 (two) times daily as needed for moderate pain or headache.   albuterol 108 (90 Base) MCG/ACT inhaler Commonly known as:  PROVENTIL HFA;VENTOLIN HFA Inhale 2 puffs into the lungs every 6 (six) hours as needed for wheezing or shortness of breath.   clindamycin 1 % external solution Commonly known as:  CLEOCIN T Apply 1 application topically 3 (three) times a week. To scalp   EPINEPHrine 0.3 mg/0.3 mL Soaj injection Commonly known as:  EPI-PEN Inject 0.3 mg into the muscle as needed (allergic reaction).   fluconazole 200 MG tablet Commonly known as:  DIFLUCAN Take 200 mg by mouth once a week.   fluticasone furoate-vilanterol 200-25 MCG/INH Aepb Commonly known as:  BREO  ELLIPTA Inhale 1 puff into the lungs daily.   methocarbamol 500 MG tablet Commonly known as:  ROBAXIN Take 1 tablet (500 mg total) by mouth every 6 (six) hours as needed for muscle spasms.   omeprazole 20 MG tablet Commonly known as:  PRILOSEC OTC Take 20 mg by mouth daily.   oxyCODONE 5 MG immediate release tablet Commonly known as:  Oxy IR/ROXICODONE Take 1-2 tablets (5-10 mg total) by mouth every 3 (three) hours as needed for moderate pain or severe pain.   rivaroxaban 10 MG Tabs tablet Commonly known as:  XARELTO Take 1 tablet (10 mg total) by mouth daily with breakfast. Take Xarelto for two and a half more weeks, then discontinue Xarelto. Once the patient has completed the blood thinner regimen, then take a Baby 81 mg Aspirin daily for three more weeks. Start taking on:  09/05/2016   traMADol 50 MG tablet Commonly known as:  ULTRAM Take 1-2 tablets (50-100 mg total) by mouth every 6 (six) hours as needed (mild pain).      Follow-up Information    Gearlean Alf, MD. Schedule an appointment as soon as possible for a visit on 09/25/2016.   Specialty:  Orthopedic Surgery Contact information: [address] [place] [address] [phone]           Signed: Arlee Muslim, PA-C Orthopaedic Surgery 09/04/2016, 8:59 AM

## 2016-09-04 NOTE — Evaluation (Signed)
Physical Therapy Evaluation Patient Details Name: Dennis Lozano MRN: [id number] DOB: December 18, 1955 Today's Date: 09/04/2016   History of Present Illness  s/p L DA THA  Clinical Impression  Pt is s/p THA resulting in the deficits listed below (see PT Problem List).  Pt will benefit from skilled PT to increase their independence and safety with mobility to allow discharge to the venue listed below.  Pt mobilizing very well this morning POD #1 and anticipates d/c home later today.  Pt very eager to return to previous lifestyle without pain and would benefit from outpatient PT if able to have transportation.         Follow Up Recommendations Outpatient PT;Home health PT (per surgeon)    Equipment Recommendations  Rolling walker with 5" wheels    Recommendations for Other Services       Precautions / Restrictions Precautions Precautions: Fall Restrictions LLE Weight Bearing: Weight bearing as tolerated      Mobility  Bed Mobility               General bed mobility comments: supervision; HOB raised  Transfers Overall transfer level: Needs assistance Equipment used: Rolling walker (2 wheeled) Transfers: Sit to/from Stand Sit to Stand: Min guard         General transfer comment: verbal cues for hand placement  Ambulation/Gait Ambulation/Gait assistance: Min guard Ambulation Distance (Feet): 180 Feet Assistive device: Rolling walker (2 wheeled) Gait Pattern/deviations: Step-through pattern;Antalgic     General Gait Details: initially step to however able to progress to step through pattern with small steps  Stairs Stairs: Yes   Stair Management: Step to pattern;Forwards Number of Stairs: 5 General stair comments: 3 and 2 performed twice with verbal cues for sequence, technique, safety  Wheelchair Mobility    Modified Rankin (Stroke Patients Only)       Balance                                             Pertinent Vitals/Pain Pain  Assessment: 0-10 Pain Score: 1  Pain Location: L hip Pain Descriptors / Indicators: Aching;Sore Pain Intervention(s): Limited activity within patient's tolerance;Monitored during session;Repositioned;Premedicated before session    Home Living Family/patient expects to be discharged to:: Private residence Living Arrangements: Spouse/significant other Available Help at Discharge: Family Type of Home: House Home Access: Stairs to enter Entrance Stairs-Rails: Right Entrance Stairs-Number of Steps: 5   Home Equipment: Grab bars - tub/shower Additional Comments: not sure about toilet height:  may be standard??    Prior Function Level of Independence: Independent               Hand Dominance        Extremity/Trunk Assessment   Upper Extremity Assessment: Overall WFL for tasks assessed           Lower Extremity Assessment: LLE deficits/detail   LLE Deficits / Details: anticipated L hip post op weakness     Communication   Communication: No difficulties  Cognition Arousal/Alertness: Awake/alert Behavior During Therapy: WFL for tasks assessed/performed Overall Cognitive Status: Within Functional Limits for tasks assessed                      General Comments      Exercises     Assessment/Plan    PT Assessment Patient needs continued PT services  PT Problem List Decreased  strength;Decreased range of motion;Decreased mobility;Decreased knowledge of use of DME;Pain          PT Treatment Interventions DME instruction;Gait training;Therapeutic activities;Functional mobility training;Therapeutic exercise;Stair training;Patient/family education    PT Goals (Current goals can be found in the Care Plan section)  Acute Rehab PT Goals Patient Stated Goal: home  PT Goal Formulation: With patient Time For Goal Achievement: 09/07/16 Potential to Achieve Goals: Good    Frequency 7X/week   Barriers to discharge        Co-evaluation                End of Session Equipment Utilized During Treatment: Gait belt Activity Tolerance: Patient tolerated treatment well Patient left: in chair;with call bell/phone within reach;with chair alarm set Nurse Communication: Mobility status         Time: [id number] PT Time Calculation (min) (ACUTE ONLY): 14 min   Charges:   PT Evaluation $PT Eval Low Complexity: 1 Procedure     PT G Codes:        Lizet Kelso,KATHrine E 09/04/2016, 9:51 AM Zenovia JarredKati Vitor Overbaugh, PT, DPT 09/04/2016 Pager: [phone]

## 2016-09-04 NOTE — Discharge Instructions (Addendum)
° °Dr. Frank Aluisio °Total Joint Specialist °[facility] °[address] °[place], [place] [address] °[phone] ° °ANTERIOR APPROACH TOTAL HIP REPLACEMENT POSTOPERATIVE DIRECTIONS ° ° °Hip Rehabilitation, Guidelines Following Surgery  °The results of a hip operation are greatly improved after range of motion and muscle strengthening exercises. Follow all safety measures which are given to protect your hip. If any of these exercises cause increased pain or swelling in your joint, decrease the amount until you are comfortable again. Then slowly increase the exercises. Call your caregiver if you have problems or questions.  ° °HOME CARE INSTRUCTIONS  °Remove items at home which could result in a fall. This includes throw rugs or furniture in walking pathways.  °· ICE to the affected hip every three hours for 30 minutes at a time and then as needed for pain and swelling.  Continue to use ice on the hip for pain and swelling from surgery. You may notice swelling that will progress down to the foot and ankle.  This is normal after surgery.  Elevate the leg when you are not up walking on it.   °· Continue to use the breathing machine which will help keep your temperature down.  It is common for your temperature to cycle up and down following surgery, especially at night when you are not up moving around and exerting yourself.  The breathing machine keeps your lungs expanded and your temperature down. ° ° °DIET °You may resume your previous home diet once your are discharged from the hospital. ° °DRESSING / WOUND CARE / SHOWERING °You may shower 3 days after surgery, but keep the wounds dry during showering.  You may use an occlusive plastic wrap (Press'n Seal for example), NO SOAKING/SUBMERGING IN THE BATHTUB.  If the bandage gets wet, change with a clean dry gauze.  If the incision gets wet, pat the wound dry with a clean towel. °You may start showering once you are discharged home but do not  submerge the incision under water. Just pat the incision dry and apply a dry gauze dressing on daily. °Change the surgical dressing daily and reapply a dry dressing each time. ° °ACTIVITY °Walk with your walker as instructed. °Use walker as long as suggested by your caregivers. °Avoid periods of inactivity such as sitting longer than an hour when not asleep. This helps prevent blood clots.  °You may resume a sexual relationship in one month or when given the OK by your doctor.  °You may return to work once you are cleared by your doctor.  °Do not drive a car for 6 weeks or until released by you surgeon.  °Do not drive while taking narcotics. ° °WEIGHT BEARING °Weight bearing as tolerated with assist device (walker, cane, etc) as directed, use it as long as suggested by your surgeon or therapist, typically at least 4-6 weeks. ° °POSTOPERATIVE CONSTIPATION PROTOCOL °Constipation - defined medically as fewer than three stools per week and severe constipation as less than one stool per week. ° °One of the most common issues patients have following surgery is constipation.  Even if you have a regular bowel pattern at home, your normal regimen is likely to be disrupted due to multiple reasons following surgery.  Combination of anesthesia, postoperative narcotics, change in appetite and fluid intake all can affect your bowels.  In order to avoid complications following surgery, here are some recommendations in order to help you during your recovery period. ° °Colace (docusate) - Pick up an over-the-counter   form of Colace or another stool softener and take twice a day as long as you are requiring postoperative pain medications.  Take with a full glass of water daily.  If you experience loose stools or diarrhea, hold the colace until you stool forms back up.  If your symptoms do not get better within 1 week or if they get worse, check with your doctor. ° °Dulcolax (bisacodyl) - Pick up over-the-counter and take as directed  by the product packaging as needed to assist with the movement of your bowels.  Take with a full glass of water.  Use this product as needed if not relieved by Colace only.  ° °MiraLax (polyethylene glycol) - Pick up over-the-counter to have on hand.  MiraLax is a solution that will increase the amount of water in your bowels to assist with bowel movements.  Take as directed and can mix with a glass of water, juice, soda, coffee, or tea.  Take if you go more than two days without a movement. °Do not use MiraLax more than once per day. Call your doctor if you are still constipated or irregular after using this medication for 7 days in a row. ° °If you continue to have problems with postoperative constipation, please contact the office for further assistance and recommendations.  If you experience "the worst abdominal pain ever" or develop nausea or vomiting, please contact the office immediatly for further recommendations for treatment. ° °ITCHING ° If you experience itching with your medications, try taking only a single pain pill, or even half a pain pill at a time.  You can also use Benadryl over the counter for itching or also to help with sleep.  ° °TED HOSE STOCKINGS °Wear the elastic stockings on both legs for three weeks following surgery during the day but you may remove then at night for sleeping. ° °MEDICATIONS °See your medication summary on the “After Visit Summary” that the nursing staff will review with you prior to discharge.  You may have some home medications which will be placed on hold until you complete the course of blood thinner medication.  It is important for you to complete the blood thinner medication as prescribed by your surgeon.  Continue your approved medications as instructed at time of discharge. ° °PRECAUTIONS °If you experience chest pain or shortness of breath - call 911 immediately for transfer to the hospital emergency department.  °If you develop a fever greater that 101 F,  purulent drainage from wound, increased redness or drainage from wound, foul odor from the wound/dressing, or calf pain - CONTACT YOUR SURGEON.   °                                                °FOLLOW-UP APPOINTMENTS °Make sure you keep all of your appointments after your operation with your surgeon and caregivers. You should call the office at the above phone number and make an appointment for approximately two weeks after the date of your surgery or on the date instructed by your surgeon outlined in the "After Visit Summary". ° °RANGE OF MOTION AND STRENGTHENING EXERCISES  °These exercises are designed to help you keep full movement of your hip joint. Follow your caregiver's or physical therapist's instructions. Perform all exercises about fifteen times, three times per day or as directed. Exercise both hips, even if you   have had only one joint replacement. These exercises can be done on a training (exercise) mat, on the floor, on a table or on a bed. Use whatever works the best and is most comfortable for you. Use music or television while you are exercising so that the exercises are a pleasant break in your day. This will make your life better with the exercises acting as a break in routine you can look forward to.  °Lying on your back, slowly slide your foot toward your buttocks, raising your knee up off the floor. Then slowly slide your foot back down until your leg is straight again.  °Lying on your back spread your legs as far apart as you can without causing discomfort.  °Lying on your side, raise your upper leg and foot straight up from the floor as far as is comfortable. Slowly lower the leg and repeat.  °Lying on your back, tighten up the muscle in the front of your thigh (quadriceps muscles). You can do this by keeping your leg straight and trying to raise your heel off the floor. This helps strengthen the largest muscle supporting your knee.  °Lying on your back, tighten up the muscles of your  buttocks both with the legs straight and with the knee bent at a comfortable angle while keeping your heel on the floor.  ° °IF YOU ARE TRANSFERRED TO A SKILLED REHAB FACILITY °If the patient is transferred to a skilled rehab facility following release from the hospital, a list of the current medications will be sent to the facility for the patient to continue.  When discharged from the skilled rehab facility, please have the facility set up the patient's Home Health Physical Therapy prior to being released. Also, the skilled facility will be responsible for providing the patient with their medications at time of release from the facility to include their pain medication, the muscle relaxants, and their blood thinner medication. If the patient is still at the rehab facility at time of the two week follow up appointment, the skilled rehab facility will also need to assist the patient in arranging follow up appointment in our office and any transportation needs. ° °MAKE SURE YOU:  °Understand these instructions.  °Get help right away if you are not doing well or get worse.  ° ° °Pick up stool softner and laxative for home use following surgery while on pain medications. °Do not submerge incision under water. °Please use good hand washing techniques while changing dressing each day. °May shower starting three days after surgery. °Please use a clean towel to pat the incision dry following showers. °Continue to use ice for pain and swelling after surgery. °Do not use any lotions or creams on the incision until instructed by your surgeon. ° °Take Xarelto for two and a half more weeks, then discontinue Xarelto. °Once the patient has completed the blood thinner regimen, then take a Baby 81 mg Aspirin daily for three more weeks. ° ° ° °Information on my medicine - XARELTO® (Rivaroxaban) ° °This medication education was reviewed with me or my healthcare representative as part of my discharge preparation.  The pharmacist that  spoke with me during my hospital stay was:  Keneshia Tena, RPH ° °Why was Xarelto® prescribed for you? °Xarelto® was prescribed for you to reduce the risk of blood clots forming after orthopedic surgery. The medical term for these abnormal blood clots is venous thromboembolism (VTE). ° °What do you need to know about xarelto® ? °Take your Xarelto® ONCE   DAILY at the same time every day. °You may take it either with or without food. ° °If you have difficulty swallowing the tablet whole, you may crush it and mix in applesauce just prior to taking your dose. ° °Take Xarelto® exactly as prescribed by your doctor and DO NOT stop taking Xarelto® without talking to the doctor who prescribed the medication.  Stopping without other VTE prevention medication to take the place of Xarelto® may increase your risk of developing a clot. ° °After discharge, you should have regular check-up appointments with your healthcare provider that is prescribing your Xarelto®.   ° °What do you do if you miss a dose? °If you miss a dose, take it as soon as you remember on the same day then continue your regularly scheduled once daily regimen the next day. Do not take two doses of Xarelto® on the same day.  ° °Important Safety Information °A possible side effect of Xarelto® is bleeding. You should call your healthcare provider right away if you experience any of the following: °? Bleeding from an injury or your nose that does not stop. °? Unusual colored urine (red or dark brown) or unusual colored stools (red or black). °? Unusual bruising for unknown reasons. °? A serious fall or if you hit your head (even if there is no bleeding). ° °Some medicines may interact with Xarelto® and might increase your risk of bleeding while on Xarelto®. To help avoid this, consult your healthcare provider or pharmacist prior to using any new prescription or non-prescription medications, including herbals, vitamins, non-steroidal anti-inflammatory drugs  (NSAIDs) and supplements. ° °This website has more information on Xarelto®: www.xarelto.com. ° ° °

## 2016-09-04 NOTE — Care Management Note (Addendum)
Case Management Note  Patient Details  Name: LINARD DAFT MRN: [id number] Date of Birth: March 10, 1956  Subjective/Objective:                  LEFT TOTAL HIP ARTHROPLASTY ANTERIOR APPROACH (Left) Action/Plan: Discharge planning Expected Discharge Date:  09/04/16               Expected Discharge Plan:  [place]  In-House Referral:     Discharge planning Services  CM Consult  Post Acute Care Choice:  Home Health Choice offered to:  Patient  DME Arranged:  3-N-1, Walker rolling DME Agency:  [place]:  Patient Refused [facility] Agency:     Status of Service:  Completed, signed off  If discussed at [employer] of Stay Meetings, dates discussed:    Additional Comments: 11:54 Charge RN, Judson Roch states MD does NOT want pt to have outpt PT and pt agrees with MD to accept HHPT from [facility].  Referral given to [facility] rep, Tim.  No other CM needs were communicated.  CM met with pt in room to offer choice of home health agency.  Pt states he would like the MD to prescribe outpt PT.  CM relayed message to charge RN who is calling MD/PA for prescription; PT eval recc outpt PT.  Pt has asked for a list of outpt PT in [place]; CM has provided list and pt verbalized once he receives prescription, he needs to call to arrange for appointment.  CM notified [facility] rep, to please deliver the rolling walker and 3n1 to room prior to discharge.  No other CM needs were communicated. Dellie Catholic, RN 09/04/2016, 10:38 AM

## 2016-09-04 NOTE — Progress Notes (Signed)
   09/04/16 1300  PT Visit Information  Last PT Received On 09/04/16  Assistance Needed +1  History of Present Illness s/p L DA THA  Subjective Data  Subjective Pt and spouse educated on HEP exercises and pt performed well.  Pt also ambulated in hallway again and is ready for d/c home today.  Precautions  Precautions Fall  Restrictions  LLE Weight Bearing WBAT  Pain Assessment  Pain Assessment 0-10  Pain Score 1  Pain Location L hip  Pain Descriptors / Indicators Aching;Sore  Pain Intervention(s) Limited activity within patient's tolerance;Monitored during session;Repositioned  Cognition  Arousal/Alertness Awake/alert  Behavior During Therapy WFL for tasks assessed/performed  Overall Cognitive Status Within Functional Limits for tasks assessed  Transfers  Overall transfer level Needs assistance  Equipment used Rolling walker (2 wheeled)  Transfers Sit to/from Stand  Sit to Stand Supervision  Ambulation/Gait  Ambulation/Gait assistance Supervision  Ambulation Distance (Feet) 200 Feet  Assistive device Rolling walker (2 wheeled)  Gait Pattern/deviations Step-through pattern;Antalgic  General Gait Details mobilizing well with RW  Exercises  Exercises Total Joint  Total Joint Exercises  Ankle Circles/Pumps AROM;10 reps;Supine;Both  Quad Sets AROM;Left;10 reps  Short Arc Quad AROM;Left;10 reps  Heel Slides AROM;Left;10 reps  Hip ABduction/ADduction AROM;Left;10 reps  Long Arc Quad AROM;Left;5 reps  Marching in Standing AROM;Left;10 reps;Standing  Standing Hip Extension AROM;Left;10 reps;Standing  PT - End of Session  Activity Tolerance Patient tolerated treatment well  Patient left Other (comment) (in bathroom trying to void with spouse present)  Nurse Communication Mobility status  PT - Assessment/Plan  PT Plan Current plan remains appropriate  PT Frequency (ACUTE ONLY) 7X/week  Follow Up Recommendations Outpatient PT;Home health PT  PT equipment Rolling walker with 5"  wheels  PT Goal Progression  Progress towards PT goals Progressing toward goals  PT Time Calculation  PT Start Time (ACUTE ONLY) 1155  PT Stop Time (ACUTE ONLY) 1214  PT Time Calculation (min) (ACUTE ONLY) 19 min  PT General Charges  $$ ACUTE PT VISIT 1 Procedure  PT Treatments  $Therapeutic Exercise 8-22 mins   Zenovia JarredKati Indie Boehne, PT, DPT 09/04/2016 Pager: [phone]

## 2016-09-04 NOTE — Evaluation (Signed)
Occupational Therapy Evaluation Patient Details Name: Dennis ChannelRon F Castonguay MRN: [id number] DOB: 1956/08/04 Today's Date: 09/04/2016    History of Present Illness s/p L DA THA   Clinical Impression   This 60 year old man was admitted for the above sx. All education was completed. No further OT needs at this time    Follow Up Recommendations  Supervision/Assistance - 24 hour    Equipment Recommendations  None recommended by OT (wants to try his commode without DME)    Recommendations for Other Services       Precautions / Restrictions Precautions Precautions: Fall Restrictions LLE Weight Bearing: Weight bearing as tolerated      Mobility Bed Mobility               General bed mobility comments: supervision; HOB raised  Transfers Overall transfer level: Needs assistance Equipment used: Rolling walker (2 wheeled) Transfers: Sit to/from Stand Sit to Stand: Min guard         General transfer comment: for safety.  Cues for UE./LE placement    Balance                                            ADL Overall ADL's : Needs assistance/impaired     Grooming: Oral care;Standing;Set up       Lower Body Bathing: Minimal assistance;Sit to/from stand;With adaptive equipment       Lower Body Dressing: Moderate assistance;With adaptive equipment;Sit to/from stand (min A for pants and underwear)   Toilet Transfer: Min guard;Ambulation;Comfort height toilet;RW   Toileting- ArchitectClothing Manipulation and Hygiene: Min guard;Sit to/from stand         General ADL Comments: performed LB adls and educated on/practiced toilet transfer.  Pt wants to try his commode at home: he feels he will be able to do so without DME.  Wife will be home for one week with him. Educated on working within pain tolerance and keeping walker in front of him for safety  Demonstrated side stepping into tub when ready.  Pt has a Optician, dispensinggrab bar     Vision     Perception     Praxis       Pertinent Vitals/Pain Pain Assessment: 0-10 Pain Score: 2  Pain Location: L hip  Pain Descriptors / Indicators: Sore Pain Intervention(s): Limited activity within patient's tolerance;Monitored during session;Premedicated before session;Repositioned;Ice applied     Hand Dominance     Extremity/Trunk Assessment Upper Extremity Assessment Upper Extremity Assessment: Overall WFL for tasks assessed           Communication Communication Communication: No difficulties   Cognition Arousal/Alertness: Awake/alert Behavior During Therapy: WFL for tasks assessed/performed Overall Cognitive Status: Within Functional Limits for tasks assessed                     General Comments       Exercises       Shoulder Instructions      Home Living Family/patient expects to be discharged to:: Private residence Living Arrangements: Spouse/significant other Available Help at Discharge: Family               Bathroom Shower/Tub: Tub/shower unit Shower/tub characteristics: Curtain FirefighterBathroom Toilet: Handicapped height     Home Equipment: Grab bars - tub/shower   Additional Comments: not sure about toilet height:  may be standard??      Prior  Functioning/Environment Level of Independence: Independent                 OT Problem List:     OT Treatment/Interventions:      OT Goals(Current goals can be found in the care plan section) Acute Rehab OT Goals Patient Stated Goal: home  OT Goal Formulation: All assessment and education complete, DC therapy  OT Frequency:     Barriers to D/C:            Co-evaluation              End of Session    Activity Tolerance: Patient tolerated treatment well Patient left: in chair;with call bell/phone within reach;with chair alarm set   Time: [id number] OT Time Calculation (min): 25 min Charges:  OT General Charges $OT Visit: 1 Procedure OT Evaluation $OT Eval Low Complexity: 1 Procedure OT Treatments $Self  Care/Home Management : 8-22 mins G-Codes:    Dyanna Seiter 09/04/2016, 9:29 AM Marica OtterMaryellen Marielle Mantione, OTR/L [phone] 09/04/2016

## 2016-09-04 NOTE — Progress Notes (Signed)
   Subjective: 1 Day Post-Op Procedure(s) (LRB): LEFT TOTAL HIP ARTHROPLASTY ANTERIOR APPROACH (Left) Patient reports pain as mild.   Patient seen in rounds for Dr. Lequita HaltAluisio. Patient is well, and has had no acute complaints or problems We will start therapy today.  If they do well with therapy and meets all goals, then will allow home later this afternoon following therapy. Plan is to go Home after hospital stay.  Objective: Vital signs in last 24 hours: Temp:  [97.5 F (36.4 C)-98.4 F (36.9 C)] 98.4 F (36.9 C) (12/12 0655) Pulse Rate:  [68-83] 74 (12/12 0655) Resp:  [2-20] 19 (12/12 0655) BP: (102-138)/(54-84) 102/54 (12/12 0655) SpO2:  [97 %-100 %] 98 % (12/12 0655) Weight:  [89.8 kg (198 lb)] 89.8 kg (198 lb) (12/11 1134)  Intake/Output from previous day:  Intake/Output Summary (Last 24 hours) at 09/04/16 0725 Last data filed at 09/04/16 0600  Gross per 24 hour  Intake          3442.92 ml  Output             2309 ml  Net          1133.92 ml    Intake/Output this shift: No intake/output data recorded.  Labs:  Recent Labs  09/04/16 0502  HGB 13.1    Recent Labs  09/04/16 0502  WBC 14.3*  RBC 4.26  HCT 37.0*  PLT 185    Recent Labs  09/04/16 0502  NA 133*  K 4.1  CL 102  CO2 25  BUN 13  CREATININE 0.96  GLUCOSE 163*  CALCIUM 8.4*   No results for input(s): LABPT, INR in the last 72 hours.  EXAM General - Patient is Alert, Appropriate and Oriented Extremity - Neurovascular intact Sensation intact distally Intact pulses distally Dorsiflexion/Plantar flexion intact Dressing - dressing C/D/I Motor Function - intact, moving foot and toes well on exam.  Hemovac pulled without difficulty.  Past Medical History:  Diagnosis Date  . Arthritis   . Asthma    slight   . GERD (gastroesophageal reflux disease)    occasion   . Hyperlipidemia   . Rheumatoid arthritis(714.0)   . Vitamin D deficiency     Assessment/Plan: 1 Day Post-Op Procedure(s)  (LRB): LEFT TOTAL HIP ARTHROPLASTY ANTERIOR APPROACH (Left) Principal Problem:   OA (osteoarthritis) of hip  Estimated body mass index is 29.24 kg/m as calculated from the following:   Height as of this encounter: 5\' 9"  (1.753 m).   Weight as of this encounter: 89.8 kg (198 lb). Up with therapy Discharge home with home health  DVT Prophylaxis - Xarelto Weight Bearing As Tolerated left Leg Hemovac Pulled Begin Therapy  If meets goals and able to go home: Discharge home with home health Diet - Cardiac diet Follow up - in 2 weeks Activity - WBAT Disposition - Home Condition Upon Discharge - improving D/C Meds - See DC Summary DVT Prophylaxis - Xarelto  Dennis Peacerew Mickell Birdwell, PA-C Orthopaedic Surgery 09/04/2016, 7:25 AM

## 2017-08-14 ENCOUNTER — Other Ambulatory Visit: Payer: Self-pay | Admitting: Internal Medicine

## 2017-08-14 MED ORDER — FLUTICASONE FUROATE-VILANTEROL 200-25 MCG/INH IN AEPB: 1.0000 | INHALATION_SPRAY | Freq: Every day | RESPIRATORY_TRACT | 0 refills | Status: DC

## 2017-08-14 NOTE — Telephone Encounter (Signed)
Breo 200 refilled for #1. Patient must be seen for future refill. Note to pharmacy.

## 2018-02-04 ENCOUNTER — Telehealth: Payer: Self-pay | Admitting: Internal Medicine

## 2018-02-04 ENCOUNTER — Ambulatory Visit: Payer: BC Managed Care – PPO | Admitting: Pulmonary Disease

## 2018-02-04 ENCOUNTER — Encounter: Payer: Self-pay | Admitting: Pulmonary Disease

## 2018-02-04 VITALS — BP 128/72 | HR 72 | Ht 69.5 in | Wt 204.0 lb

## 2018-02-04 DIAGNOSIS — R042 Hemoptysis: Secondary | ICD-10-CM

## 2018-02-04 DIAGNOSIS — J4520 Mild intermittent asthma, uncomplicated: Secondary | ICD-10-CM | POA: Diagnosis not present

## 2018-02-04 LAB — NITRIC OXIDE: Nitric Oxide: 27

## 2018-02-04 NOTE — Telephone Encounter (Signed)
3 attempts to schedule fu appt from recall list.   Deleting recall.  Patient declined to schedule at this time he states he is doing ok .

## 2018-02-04 NOTE — Patient Instructions (Signed)
We will schedule you for a CT chest without contrast for better evaluation of your atypical chest pain and hemoptysis I will give you a call regarding the CT scan and will decide if further follow-up needs to be arranged

## 2018-02-04 NOTE — Progress Notes (Addendum)
Dennis Lozano    [id number]    09/11/1956  Primary Care Physician:Pharr, Zollie Beckers, MD  Referring Physician: Merri Brunette, MD [address] [place], [place] [address]  Chief complaint: Consult for atypical chest pain, spitting up blood.  HPI: 62 year old with history of rheumatoid arthritis, asthma, GERD Complains of left chest tightness, discomfort for the past 2 months.  No particular aggravating or relieving factors.  He reports that the pain is getting better over time.  He was seen by Dr. Renne Crigler, primary care and had an EKG which is reportedly normal.  He also had a chest x-ray with no acute abnormality. Also reports spitting up blood a couple of times over the past 2 years.  He has not coughed up any mucus.  Feels that the blood could be coming from his teeth.  Has a history of asthma.  He has been prescribed Breo 100/25 and Proventil.  He is using the [facility] very rarely and not using the Proventil at all.  Denies any cough, sputum production, wheezing, dyspnea.  Denies seasonal allergies.  He has occasional GERD symptoms History noted for rheumatoid arthritis.  He is currently on Enbrel.  Follows with Dr. Dierdre Forth, rheumatology.  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  Does not use down comforters or pillows. Smoking history: Never smoker Travel history: Lived in [place], [place], [place] Relevant family history: No significant family history of lung issues.  Outpatient Encounter Medications as of 02/04/2018  Medication Sig  . acetaminophen (TYLENOL) 500 MG tablet Take 1,000-1,500 mg by mouth 2 (two) times daily as needed for moderate pain or headache.  Elgie Collard SURECLICK 50 MG/ML injection   . omeprazole (PRILOSEC OTC) 20 MG tablet Take 20 mg by mouth daily as needed.   . [DISCONTINUED] clindamycin (CLEOCIN T) 1 % external solution  Apply 1 application topically 3 (three) times a week. To scalp  . [DISCONTINUED] EPINEPHrine 0.3 mg/0.3 mL IJ SOAJ injection Inject 0.3 mg into the muscle as needed (allergic reaction).   . [DISCONTINUED] fluconazole (DIFLUCAN) 200 MG tablet Take 200 mg by mouth once a week.  . [DISCONTINUED] fluticasone furoate-vilanterol (BREO ELLIPTA) 200-25 MCG/INH AEPB Inhale 1 puff into the lungs daily.  . [DISCONTINUED] methocarbamol (ROBAXIN) 500 MG tablet Take 1 tablet (500 mg total) by mouth every 6 (six) hours as needed for muscle spasms.  . [DISCONTINUED] oxyCODONE (OXY IR/ROXICODONE) 5 MG immediate release tablet Take 1-2 tablets (5-10 mg total) by mouth every 3 (three) hours as needed for moderate pain or severe pain.  . [DISCONTINUED] rivaroxaban (XARELTO) 10 MG TABS tablet Take 1 tablet (10 mg total) by mouth daily with breakfast. Take Xarelto for two and a half more weeks, then discontinue Xarelto. Once the patient has completed the blood thinner regimen, then take a Baby 81 mg Aspirin daily for three more weeks.  . [DISCONTINUED] traMADol (ULTRAM) 50 MG tablet Take 1-2 tablets (50-100 mg total) by mouth every 6 (six) hours as needed (mild pain).  Marland Kitchen albuterol (PROVENTIL HFA;VENTOLIN HFA) 108 (90 Base) MCG/ACT inhaler Inhale 2 puffs into the lungs every 6 (six) hours as needed for wheezing or shortness of breath. (Patient not taking: Reported on 02/04/2018)   No facility-administered encounter medications on file as of 02/04/2018.     Allergies as of 02/04/2018 - Review Complete 02/04/2018  Allergen Reaction Noted  .  Penicillins Rash     Past Medical History:  Diagnosis Date  . Arthritis   . Asthma    slight   . GERD (gastroesophageal reflux disease)    occasion   . Hyperlipidemia   . Rheumatoid arthritis(714.0)   . Vitamin D deficiency     Past Surgical History:  Procedure Laterality Date  . APPENDECTOMY    . OLECRANON BURSECTOMY    . TOTAL HIP ARTHROPLASTY Left 09/03/2016    Procedure: LEFT TOTAL HIP ARTHROPLASTY ANTERIOR APPROACH;  Surgeon: Ollen Gross, MD;  Location: [facility];  Service: Orthopedics;  Laterality: Left;    Family History  Problem Relation Age of Onset  . Rheum arthritis Mother     Social History   Socioeconomic History  . Marital status: Married    Spouse name: Not on file  . Number of children: Not on file  . Years of education: Not on file  . Highest education level: Not on file  Occupational History  . Occupation: professor    Employer: [employer]  Social Needs  . Financial resource strain: Not on file  . Food insecurity:    Worry: Not on file    Inability: Not on file  . Transportation needs:    Medical: Not on file    Non-medical: Not on file  Tobacco Use  . Smoking status: Never Smoker  . Smokeless tobacco: Never Used  Substance and Sexual Activity  . Alcohol use: Yes    Comment: occasional, beer once per week   . Drug use: No  . Sexual activity: Not on file  Lifestyle  . Physical activity:    Days per week: Not on file    Minutes per session: Not on file  . Stress: Not on file  Relationships  . Social connections:    Talks on phone: Not on file    Gets together: Not on file    Attends religious service: Not on file    Active member of club or organization: Not on file    Attends meetings of clubs or organizations: Not on file    Relationship status: Not on file  . Intimate partner violence:    Fear of current or ex partner: Not on file    Emotionally abused: Not on file    Physically abused: Not on file    Forced sexual activity: Not on file  Other Topics Concern  . Not on file  Social History Narrative  . Not on file    Review of systems: Review of Systems  Constitutional: Negative for fever and chills.  HENT: Negative.   Eyes: Negative for blurred vision.  Respiratory: as per HPI  Cardiovascular: Negative for chest pain and palpitations.  Gastrointestinal: Negative for vomiting, diarrhea, blood per  rectum. Genitourinary: Negative for dysuria, urgency, frequency and hematuria.  Musculoskeletal: Negative for myalgias, back pain and joint pain.  Skin: Negative for itching and rash.  Neurological: Negative for dizziness, tremors, focal weakness, seizures and loss of consciousness.  Endo/Heme/Allergies: Negative for environmental allergies.  Psychiatric/Behavioral: Negative for depression, suicidal ideas and hallucinations.  All other systems reviewed and are negative.  Physical Exam: Blood pressure 128/72, pulse 72, height 5' 9.5" (1.765 m), weight 204 lb (92.5 kg), SpO2 97 %. Gen:      No acute distress HEENT:  EOMI, sclera anicteric Neck:     No masses; no thyromegaly Lungs:    Clear to auscultation bilaterally; normal respiratory effort CV:  Regular rate and rhythm; no murmurs Abd:      + bowel sounds; soft, non-tender; no palpable masses, no distension Ext:    No edema; adequate peripheral perfusion Skin:      Warm and dry; no rash Neuro: alert and oriented x 3 Psych: normal mood and affect  Data Reviewed: Chest x-ray 01/17/2018- no acute cardiopulmonary normality.  I have reviewed the images personally  PFTs 02/26/2011 FVC 3.83 [82%], FEV1 2.79 [83%], F/F 73, TLC 92%, DLCO 123% No obstruction or bronchodilator response.  Normal lung volumes and diffusion capacity.  Assessment:  Chest discomfort, hemoptysis Chest discomfort is atypical in nature.  He has had normal EKG at his primary care.  Reported hemoptysis appears minimal and sounds more like spitting up blood originating in the oral cavity Discussed with patient.  We decided to get a CT chest to be on the safe side.  Mild intermittent asthma Symptoms are well controlled.  He is using his inhalers only intermittently.  Plan/Recommendations: - CT chest without contrast  Chilton Greathouse MD [facility] 02/04/2018, 2:10 PM  CC: Merri Brunette, MD

## 2018-02-19 ENCOUNTER — Ambulatory Visit (INDEPENDENT_AMBULATORY_CARE_PROVIDER_SITE_OTHER)
Admission: RE | Admit: 2018-02-19 | Discharge: 2018-02-19 | Disposition: A | Discharge status: Home / Self Care | Payer: BC Managed Care – PPO | Source: Ambulatory Visit | Attending: Pulmonary Disease | Admitting: Pulmonary Disease

## 2018-02-19 DIAGNOSIS — R042 Hemoptysis: Secondary | ICD-10-CM

## 2018-08-08 IMAGING — CT CT CHEST W/O CM
2 of 3 series · 15 of 36 positions shown, 18 images · non-contrast
Comparison: None.

CLINICAL DATA: Hemoptysis for several days.  Atypical chest pain.

EXAM:
CT CHEST WITHOUT CONTRAST
TECHNIQUE: Multidetector CT imaging of the chest was performed following the
standard protocol without IV contrast.

[Series 2: thorax · axial · 0.76mm/px · z∈[-302,-52]mm · 12 of 147 slices shown, 15 images]
[im 11/147  mediastinal]
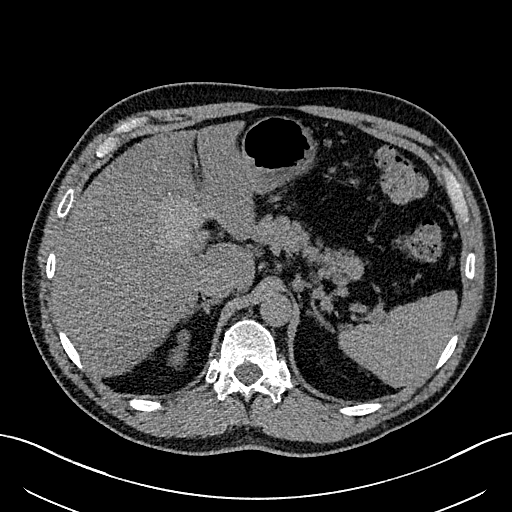
[im 11/147  lung]
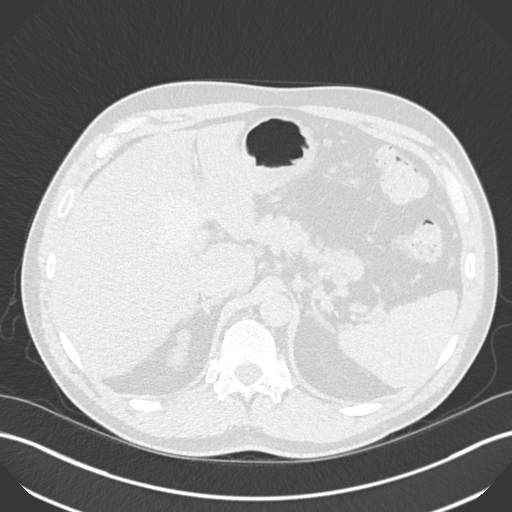
[im 22/147  lung]
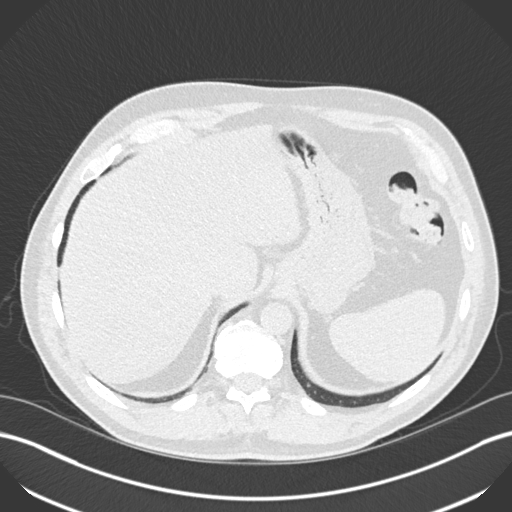
[im 33/147  lung]
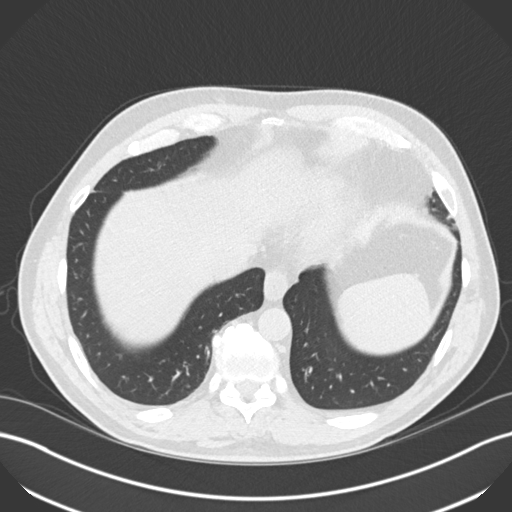
[im 44/147  lung]
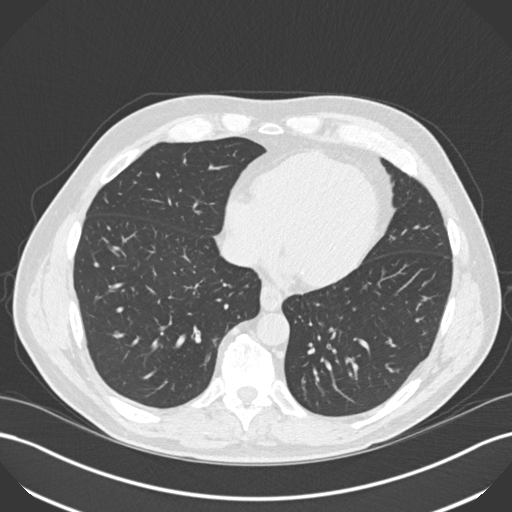
[im 55/147  mediastinal]
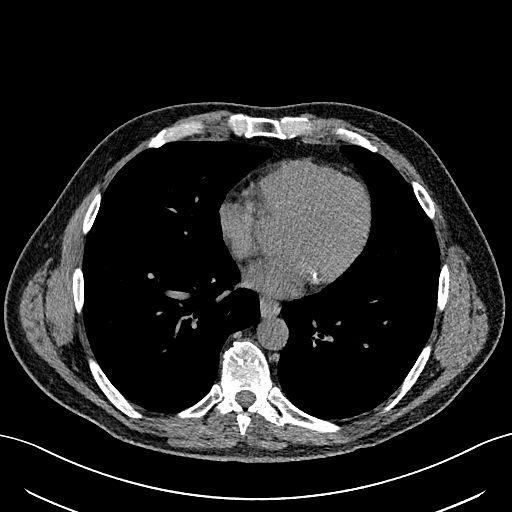
[im 55/147  lung]
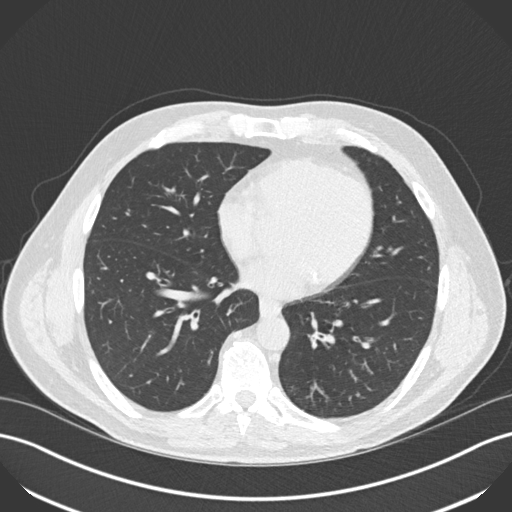
[im 65/147  lung]
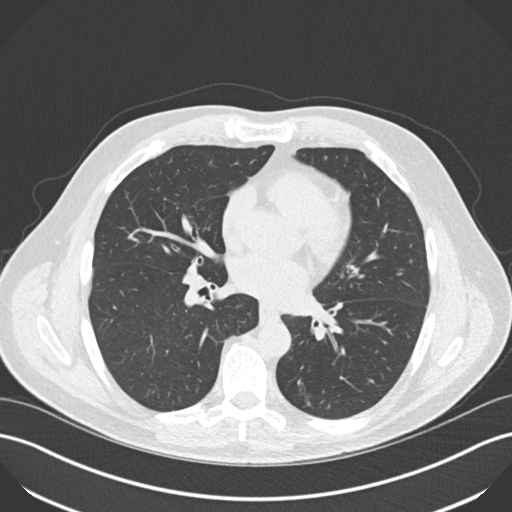
[im 82/147  lung]
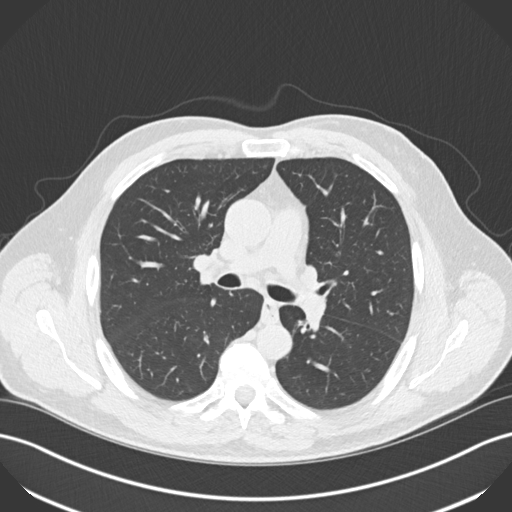
[im 92/147  lung]
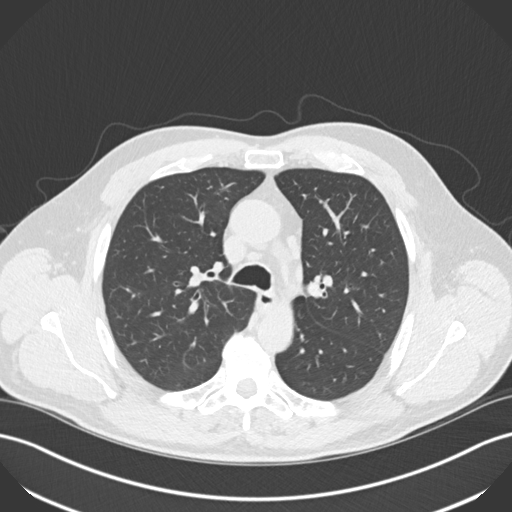
[im 103/147  mediastinal]
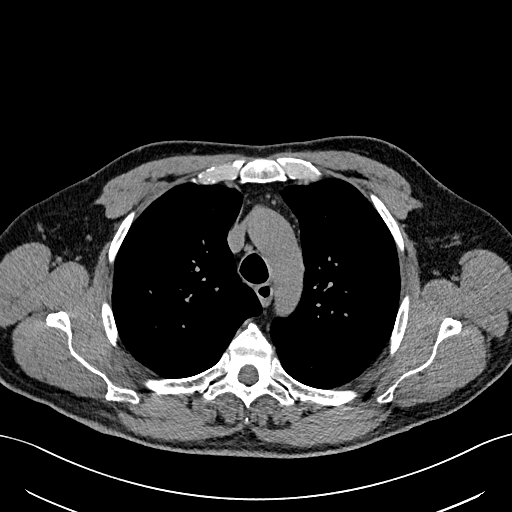
[im 103/147  lung]
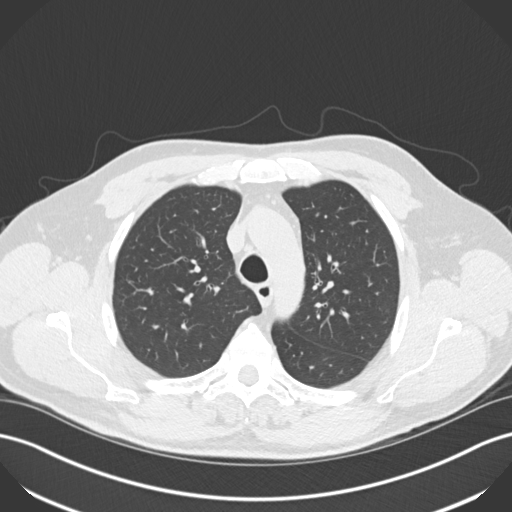
[im 114/147  lung]
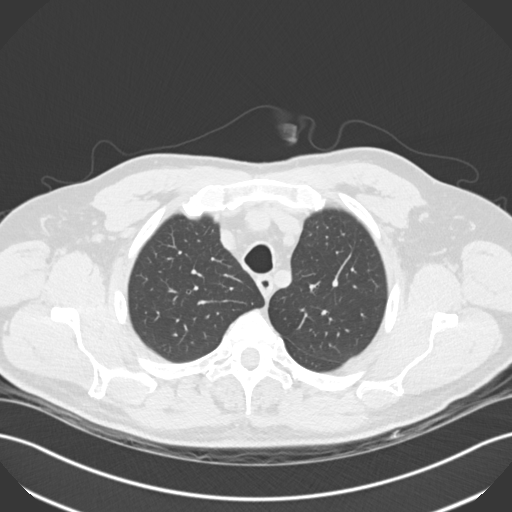
[im 125/147  lung]
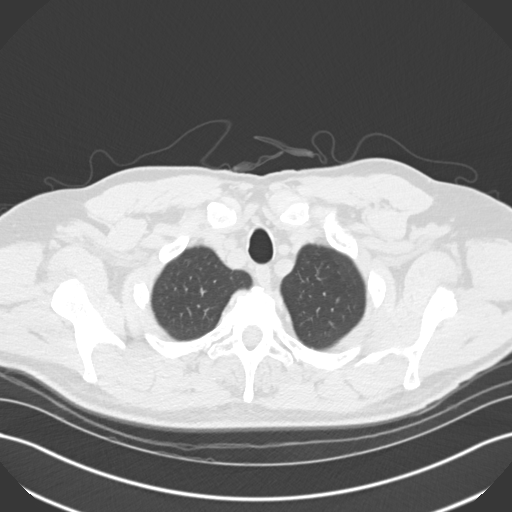
[im 136/147  lung]
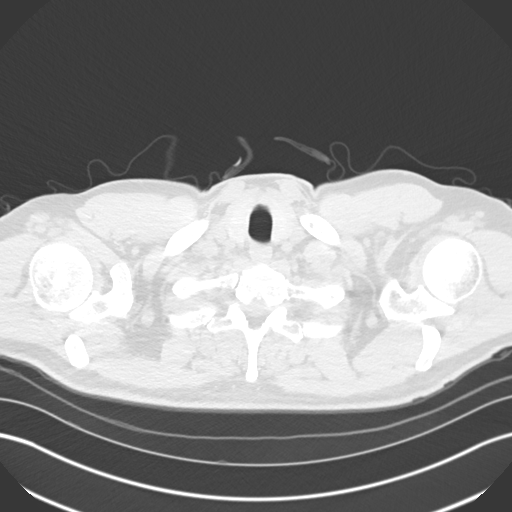

[Series 5: coronal · coronal · 0.59mm/px · 3 of 132 slices shown]
[im 27/132  lung]
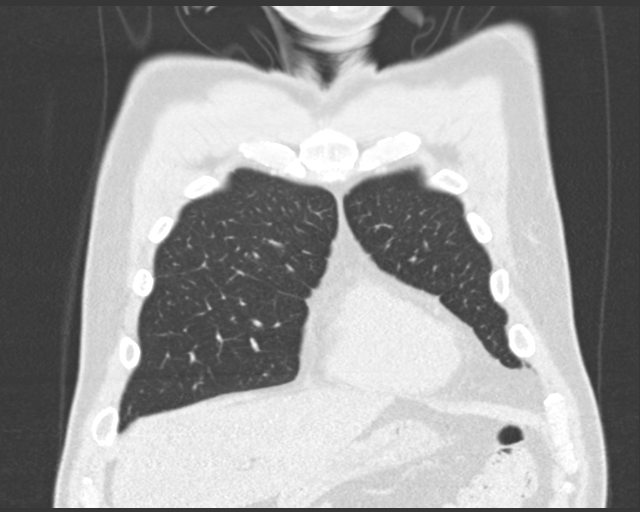
[im 53/132  lung]
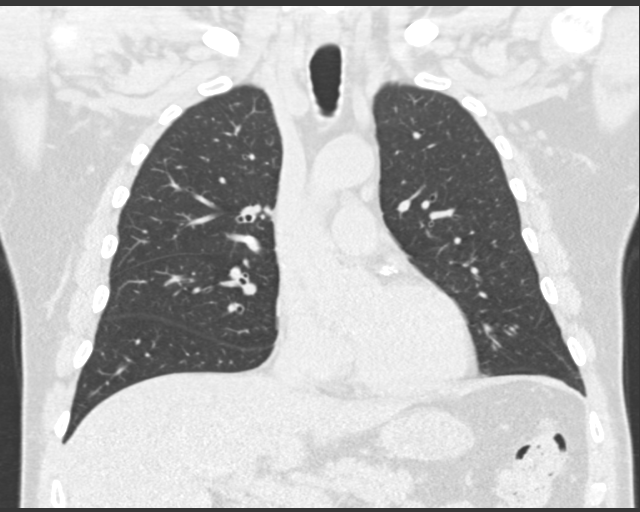
[im 79/132  lung]
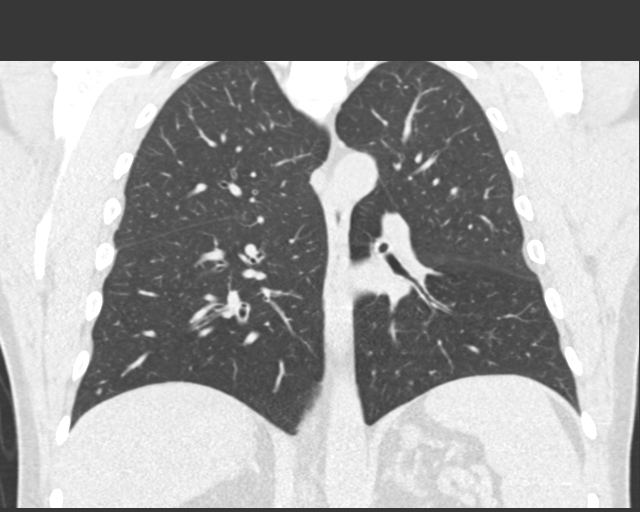

[15 of 36 positions shown; findings below may reference images not displayed]

FINDINGS: Cardiovascular:  No acute findings. Coronary artery calcification.

Mediastinum/Nodes: No masses or pathologically enlarged lymph nodes
identified on this unenhanced exam. Mild long segment wall
thickening of the mid and distal esophagus, suspicious for
esophagitis. No evidence of hiatal hernia.

Lungs/Pleura: No pulmonary infiltrate or mass identified. No
evidence of bronchiectasis or chronic interstitial lung disease. No
effusion present.

Upper Abdomen: Diffuse hepatic steatosis, with central areas of
focal fatty sparing adjacent to the porta hepatis.

Musculoskeletal:  No suspicious bone lesions.
IMPRESSION: No evidence of mass or other active lung disease.

Mild wall thickening of mid and distal thoracic esophagus,
suspicious for esophagitis.

Coronary artery calcification.

Hepatic steatosis.

## 2022-01-17 ENCOUNTER — Other Ambulatory Visit: Payer: Self-pay | Admitting: Internal Medicine

## 2022-01-17 DIAGNOSIS — R1012 Left upper quadrant pain: Secondary | ICD-10-CM

## 2022-02-13 ENCOUNTER — Ambulatory Visit
Admission: RE | Admit: 2022-02-13 | Discharge: 2022-02-13 | Disposition: A | Discharge status: Home / Self Care | Payer: Self-pay | Source: Ambulatory Visit | Attending: Internal Medicine | Admitting: Internal Medicine

## 2022-02-13 DIAGNOSIS — R1012 Left upper quadrant pain: Secondary | ICD-10-CM

## 2022-02-26 ENCOUNTER — Other Ambulatory Visit: Payer: Self-pay

## 2022-02-26 ENCOUNTER — Ambulatory Visit: Payer: BC Managed Care – PPO | Admitting: Pulmonary Disease

## 2022-02-26 DIAGNOSIS — R0609 Other forms of dyspnea: Secondary | ICD-10-CM

## 2022-02-26 LAB — PULMONARY FUNCTION TEST
DL/VA % pred: 129 %
DL/VA: 5.28 ml/min/mmHg/L
DLCO cor % pred: 118 %
DLCO cor: 30.99 ml/min/mmHg
DLCO unc % pred: 118 %
DLCO unc: 30.99 ml/min/mmHg
FEF 25-75 Post: 1.4 L/sec
FEF 25-75 Pre: 1.42 L/sec
FEF2575-%Change-Post: -1 %
FEF2575-%Pred-Post: 54 %
FEF2575-%Pred-Pre: 55 %
FEV1-%Change-Post: 0 %
FEV1-%Pred-Post: 65 %
FEV1-%Pred-Pre: 65 %
FEV1-Post: 2.19 L
FEV1-Pre: 2.19 L
FEV1FVC-%Change-Post: 2 %
FEV1FVC-%Pred-Pre: 96 %
FEV6-%Change-Post: -1 %
FEV6-%Pred-Post: 70 %
FEV6-%Pred-Pre: 71 %
FEV6-Post: 2.98 L
FEV6-Pre: 3.03 L
FEV6FVC-%Change-Post: 1 %
FEV6FVC-%Pred-Post: 105 %
FEV6FVC-%Pred-Pre: 103 %
FVC-%Change-Post: -2 %
FVC-%Pred-Post: 66 %
FVC-%Pred-Pre: 68 %
FVC-Post: 3 L
FVC-Pre: 3.08 L
Post FEV1/FVC ratio: 73 %
Post FEV6/FVC ratio: 100 %
Pre FEV1/FVC ratio: 71 %
Pre FEV6/FVC Ratio: 98 %

## 2022-02-26 NOTE — Progress Notes (Signed)
Attempted Full PFT Today. Performed Pre/Post Spirometry and DLCO.  

## 2022-02-26 NOTE — Patient Instructions (Signed)
Attempted Full PFT Today. Performed Pre/Post Spirometry and DLCO.  

## 2022-03-13 ENCOUNTER — Encounter: Payer: Self-pay | Admitting: Pulmonary Disease

## 2022-03-13 ENCOUNTER — Ambulatory Visit: Payer: BC Managed Care – PPO | Admitting: Pulmonary Disease

## 2022-03-13 VITALS — BP 138/64 | HR 64 | Temp 98.0°F | Ht 70.0 in | Wt 201.4 lb

## 2022-03-13 DIAGNOSIS — R0602 Shortness of breath: Secondary | ICD-10-CM | POA: Diagnosis not present

## 2022-03-13 DIAGNOSIS — R0609 Other forms of dyspnea: Secondary | ICD-10-CM

## 2022-03-13 NOTE — Progress Notes (Unsigned)
Dennis Lozano    [id number]    17-Dec-1955  Primary Care Physician:Pharr, Zollie Beckers, MD  Referring Physician: Merri Brunette, MD [address] [place],  [place] [address]  Chief complaint: Consult for atypical chest pain, spitting up blood.  HPI: 66 year old with history of rheumatoid arthritis, asthma, GERD Complains of left chest tightness, discomfort for the past 2 months.  No particular aggravating or relieving factors.  He reports that the pain is getting better over time.  He was seen by Dr. Renne Crigler, primary care and had an EKG which is reportedly normal.  He also had a chest x-ray with no acute abnormality. Also reports spitting up blood a couple of times over the past 2 years.  He has not coughed up any mucus.  Feels that the blood could be coming from his teeth.  Has a history of asthma.  He has been prescribed Breo 100/25 and Proventil.  He is using the [facility] very rarely and not using the Proventil at all.  Denies any cough, sputum production, wheezing, dyspnea.  Denies seasonal allergies.  He has occasional GERD symptoms History noted for rheumatoid arthritis.  He is currently on Enbrel.  Follows with Dr. Dierdre Forth, rheumatology.  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  Does not use down comforters or pillows. Smoking history: Never smoker Travel history: Lived in [place], [place], [place] Relevant family history: No significant family history of lung issues.  Outpatient Encounter Medications as of 03/13/2022  Medication Sig   ENBREL SURECLICK 50 MG/ML injection    fluticasone furoate-vilanterol (BREO ELLIPTA) 200-25 MCG/ACT AEPB USE ONE PUFF ONCE A DAY.   pantoprazole (PROTONIX) 40 MG tablet 1 tablet   acetaminophen (TYLENOL) 500 MG tablet Take 1,000-1,500 mg by mouth 2 (two) times daily as needed for moderate pain or headache.    albuterol (PROVENTIL HFA;VENTOLIN HFA) 108 (90 Base) MCG/ACT inhaler Inhale 2 puffs into the lungs every 6 (six) hours as needed for wheezing or shortness of breath. (Patient not taking: Reported on 02/04/2018)   [DISCONTINUED] omeprazole (PRILOSEC OTC) 20 MG tablet Take 20 mg by mouth daily as needed.    No facility-administered encounter medications on file as of 03/13/2022.    Allergies as of 03/13/2022 - Review Complete 03/13/2022  Allergen Reaction Noted   Penicillins Rash     Past Medical History:  Diagnosis Date   Arthritis    Asthma    slight    GERD (gastroesophageal reflux disease)    occasion    Hyperlipidemia    Rheumatoid arthritis(714.0)    Vitamin D deficiency     Past Surgical History:  Procedure Laterality Date   APPENDECTOMY     OLECRANON BURSECTOMY     TOTAL HIP ARTHROPLASTY Left 09/03/2016   Procedure: LEFT TOTAL HIP ARTHROPLASTY ANTERIOR APPROACH;  Surgeon: Ollen Gross, MD;  Location: [facility];  Service: Orthopedics;  Laterality: Left;    Family History  Problem Relation Age of Onset   Rheum arthritis Mother     Social History   Socioeconomic History   Marital status: Married    Spouse name: Not on file   Number of children: Not on file   Years of education: Not on file   Highest education level: Not on file  Occupational History   Occupation: professor    Employer: [employer]  Tobacco Use  Smoking status: Never   Smokeless tobacco: Never  Substance and Sexual Activity   Alcohol use: Yes    Comment: occasional, beer once per week    Drug use: No   Sexual activity: Not on file  Other Topics Concern   Not on file  Social History Narrative   Not on file   Social Determinants of Health   Financial Resource Strain: Not on file  Food Insecurity: Not on file  Transportation Needs: Not on file  Physical Activity: Not on file  Stress: Not on file  Social Connections: Not on file  Intimate Partner Violence: Not on file    Review of  systems: Review of Systems  Constitutional: Negative for fever and chills.  HENT: Negative.   Eyes: Negative for blurred vision.  Respiratory: as per HPI  Cardiovascular: Negative for chest pain and palpitations.  Gastrointestinal: Negative for vomiting, diarrhea, blood per rectum. Genitourinary: Negative for dysuria, urgency, frequency and hematuria.  Musculoskeletal: Negative for myalgias, back pain and joint pain.  Skin: Negative for itching and rash.  Neurological: Negative for dizziness, tremors, focal weakness, seizures and loss of consciousness.  Endo/Heme/Allergies: Negative for environmental allergies.  Psychiatric/Behavioral: Negative for depression, suicidal ideas and hallucinations.  All other systems reviewed and are negative.  Physical Exam: Blood pressure 128/72, pulse 72, height 5' 9.5" (1.765 m), weight 204 lb (92.5 kg), SpO2 97 %. Gen:      No acute distress HEENT:  EOMI, sclera anicteric Neck:     No masses; no thyromegaly Lungs:    Clear to auscultation bilaterally; normal respiratory effort CV:         Regular rate and rhythm; no murmurs Abd:      + bowel sounds; soft, non-tender; no palpable masses, no distension Ext:    No edema; adequate peripheral perfusion Skin:      Warm and dry; no rash Neuro: alert and oriented x 3 Psych: normal mood and affect  Data Reviewed: Chest x-ray 01/17/2018- no acute cardiopulmonary normality.  I have reviewed the images personally  PFTs 02/26/2011 FVC 3.83 [82%], FEV1 2.79 [83%], F/F 73, TLC 92%, DLCO 123% No obstruction or bronchodilator response.  Normal lung volumes and diffusion capacity.  Assessment:  Chest discomfort, hemoptysis Chest discomfort is atypical in nature.  He has had normal EKG at his primary care.  Reported hemoptysis appears minimal and sounds more like spitting up blood originating in the oral cavity Discussed with patient.  We decided to get a CT chest to be on the safe side.  Mild intermittent  asthma Symptoms are well controlled.  He is using his inhalers only intermittently.  Plan/Recommendations: - CT chest without contrast  Chilton Greathouse MD [facility] 03/13/2022, 10:54 AM  CC: Merri Brunette, MD

## 2022-03-13 NOTE — Patient Instructions (Signed)
I have reviewed your lung function test which looks good Continue the [facility] as prescribed We will order high-res CT for further evaluation of your lungs Follow-up in 3 months.

## 2022-04-24 ENCOUNTER — Ambulatory Visit
Admission: RE | Admit: 2022-04-24 | Discharge: 2022-04-24 | Disposition: A | Discharge status: Home / Self Care | Payer: BC Managed Care – PPO | Source: Ambulatory Visit | Attending: Pulmonary Disease | Admitting: Pulmonary Disease

## 2022-04-24 DIAGNOSIS — R0602 Shortness of breath: Secondary | ICD-10-CM

## 2022-05-31 NOTE — Progress Notes (Unsigned)
Cardiology Office Note:    Date:  05/31/2022   ID:  Dennis Lozano, DOB 1956-04-08, MRN [id number]  PCP:  Merri Brunette, MD   [facility] Providers Cardiologist:  None { Click to update primary MD,subspecialty MD or APP then REFRESH:1}    Referring MD: Merri Brunette, MD   No chief complaint on file. ***  History of Present Illness:    Dennis Lozano is a 66 y.o. male seen at the request of Dr Renne Crigler for evaluation of coronary calcification noted on CT. He has a history of RA and HLD.  Past Medical History:  Diagnosis Date   Arthritis    Asthma    slight    GERD (gastroesophageal reflux disease)    occasion    Hyperlipidemia    Rheumatoid arthritis(714.0)    Vitamin D deficiency     Past Surgical History:  Procedure Laterality Date   APPENDECTOMY     OLECRANON BURSECTOMY     TOTAL HIP ARTHROPLASTY Left 09/03/2016   Procedure: LEFT TOTAL HIP ARTHROPLASTY ANTERIOR APPROACH;  Surgeon: Ollen Gross, MD;  Location: [facility];  Service: Orthopedics;  Laterality: Left;    Current Medications: No outpatient medications have been marked as taking for the 06/04/22 encounter (Appointment) with [place], Milia Warth M, MD.     Allergies:   Penicillins   Social History   Socioeconomic History   Marital status: Married    Spouse name: Not on file   Number of children: Not on file   Years of education: Not on file   Highest education level: Not on file  Occupational History   Occupation: professor    Employer: [employer]  Tobacco Use   Smoking status: Never   Smokeless tobacco: Never  Substance and Sexual Activity   Alcohol use: Yes    Comment: occasional, beer once per week    Drug use: No   Sexual activity: Not on file  Other Topics Concern   Not on file  Social History Narrative   Not on file   Social Determinants of Health   Financial Resource Strain: Not on file  Food Insecurity: Not on file  Transportation Needs: Not on file  Physical Activity: Not on  file  Stress: Not on file  Social Connections: Not on file     Family History: The patient's ***family history includes Rheum arthritis in his mother.  ROS:   Please see the history of present illness.    *** All other systems reviewed and are negative.  EKGs/Labs/Other Studies Reviewed:    The following studies were reviewed today: CT CHEST WITHOUT CONTRAST   TECHNIQUE: Multidetector CT imaging of the chest was performed following the standard protocol without intravenous contrast. High resolution imaging of the lungs, as well as inspiratory and expiratory imaging, was performed.   RADIATION DOSE REDUCTION: This exam was performed according to the departmental dose-optimization program which includes automated exposure control, adjustment of the mA and/or kV according to patient size and/or use of iterative reconstruction technique.   COMPARISON:  [id number] chest CT.   FINDINGS: Cardiovascular: Normal heart size. No significant pericardial effusion/thickening. Left anterior descending and right coronary atherosclerosis. Atherosclerotic nonaneurysmal thoracic aorta. Normal caliber pulmonary arteries.   Mediastinum/Nodes: No discrete thyroid nodules. Unremarkable esophagus. No pathologically enlarged axillary, mediastinal or hilar lymph nodes, noting limited sensitivity for the detection of hilar adenopathy on this noncontrast study.   Lungs/Pleura: No pneumothorax. No pleural effusion. No acute consolidative airspace disease, lung masses or significant pulmonary  nodules. No significant lobular air trapping or evidence of tracheobronchomalacia on the expiration sequence. No significant regions of subpleural reticulation, ground-glass opacity, architectural distortion or frank honeycombing. Mild cylindrical bronchiolectasis in the posteromedial basilar right lower lobe (series 10/images 225 and 235). Thin parenchymal bands in the inferior lingula are unchanged and  compatible with nonspecific mild postinfectious/postinflammatory scarring.   Upper abdomen: Indistinct subcapsular region of hyperdensity in the central liver adjacent to the porta hepatis (series 2/image 138), not appreciably changed back to the 02/12/2018 chest CT, most compatible with a benign etiology such as mild fatty sparing. Simple exophytic 1.4 cm upper right renal cyst, for which no follow-up imaging is recommended.   Musculoskeletal: No aggressive appearing focal osseous lesions. Moderate thoracic spondylosis.   IMPRESSION: 1. No evidence of interstitial lung disease. 2. Mild cylindrical bronchiolectasis in the posteromedial basilar right lower lobe. 3. Two-vessel coronary atherosclerosis. 4. Aortic Atherosclerosis (ICD10-I70.0).     Electronically Signed   By: Delbert Phenix M.D.   On: 04/24/2022 14:50    EKG:  EKG is *** ordered today.  The ekg ordered today demonstrates ***  Recent Labs: No results found for requested labs within last 365 days.  Recent Lipid Panel No results found for: "CHOL", "TRIG", "HDL", "CHOLHDL", "VLDL", "LDLCALC", "LDLDIRECT"  Dated 12/13/21: creatinine 1.14. otherwise CMET and CBC normal Dated 04/26/22: cholesterol 175, triglycerides 186, HDL 35. Non HDL 140.  Risk Assessment/Calculations:   {Does this patient have ATRIAL FIBRILLATION?[phone]}  No BP recorded.  {Refresh Note OR Click here to enter BP  :1}***         Physical Exam:    VS:  There were no vitals taken for this visit.    Wt Readings from Last 3 Encounters:  03/13/22 201 lb 6.4 oz (91.4 kg)  02/04/18 204 lb (92.5 kg)  09/03/16 198 lb (89.8 kg)     GEN: *** Well nourished, well developed in no acute distress HEENT: Normal NECK: No JVD; No carotid bruits LYMPHATICS: No lymphadenopathy CARDIAC: ***RRR, no murmurs, rubs, gallops RESPIRATORY:  Clear to auscultation without rales, wheezing or rhonchi  ABDOMEN: Soft, non-tender, non-distended MUSCULOSKELETAL:  No  edema; No deformity  SKIN: Warm and dry NEUROLOGIC:  Alert and oriented x 3 PSYCHIATRIC:  Normal affect   ASSESSMENT:    No diagnosis found. PLAN:    In order of problems listed above:  ***      {Are you ordering a CV Procedure (e.g. stress test, cath, DCCV, TEE, etc)?   Press F2        :[id number]}    Medication Adjustments/Labs and Tests Ordered: Current medicines are reviewed at length with the patient today.  Concerns regarding medicines are outlined above.  No orders of the defined types were placed in this encounter.  No orders of the defined types were placed in this encounter.   There are no Patient Instructions on file for this visit.   Signed, Tamotsu Wiederholt [place], MD  05/31/2022 12:54 PM    [facility]

## 2022-06-04 ENCOUNTER — Encounter: Payer: Self-pay | Admitting: Cardiology

## 2022-06-04 ENCOUNTER — Ambulatory Visit: Payer: BC Managed Care – PPO | Attending: Cardiology | Admitting: Cardiology

## 2022-06-04 VITALS — BP 128/76 | HR 58 | Ht 70.0 in | Wt 201.0 lb

## 2022-06-04 DIAGNOSIS — I2510 Atherosclerotic heart disease of native coronary artery without angina pectoris: Secondary | ICD-10-CM

## 2022-06-04 DIAGNOSIS — I493 Ventricular premature depolarization: Secondary | ICD-10-CM

## 2022-06-04 DIAGNOSIS — I2584 Coronary atherosclerosis due to calcified coronary lesion: Secondary | ICD-10-CM

## 2022-06-04 DIAGNOSIS — R000 Tachycardia, unspecified: Secondary | ICD-10-CM | POA: Diagnosis not present

## 2022-06-04 DIAGNOSIS — R079 Chest pain, unspecified: Secondary | ICD-10-CM

## 2022-06-04 DIAGNOSIS — E7800 Pure hypercholesterolemia, unspecified: Secondary | ICD-10-CM

## 2022-06-04 MED ORDER — ASPIRIN 81 MG PO TBEC: 81.0000 mg | DELAYED_RELEASE_TABLET | Freq: Every day | ORAL | 3 refills | Status: DC

## 2022-06-04 NOTE — Patient Instructions (Signed)
Medication Instructions:  Start taking Aspirin 81 mg daily Continue all other medications   Lab Work: None ordered   Testing/Procedures: Stress Myoview  Echo   Follow-Up: At [facility], you and your health needs are our priority.  As part of our continuing mission to provide you with exceptional heart care, we have created designated Provider Care Teams.  These Care Teams include your primary Cardiologist (physician) and Advanced Practice Providers (APPs -  Physician Assistants and Nurse Practitioners) who all work together to provide you with the care you need, when you need it.  We recommend signing up for the patient portal called "MyChart".  Sign up information is provided on this After Visit Summary.  MyChart is used to connect with patients for Virtual Visits (Telemedicine).  Patients are able to view lab/test results, encounter notes, upcoming appointments, etc.  Non-urgent messages can be sent to your provider as well.   To learn more about what you can do with MyChart, go to [email or web address].    Your next appointment:  After test    The format for your next appointment: Office   Provider:  Dr.Jordan   Important Information About Sugar

## 2022-06-13 ENCOUNTER — Ambulatory Visit (HOSPITAL_COMMUNITY): Payer: BC Managed Care – PPO | Attending: Cardiology

## 2022-06-13 ENCOUNTER — Encounter: Payer: Self-pay | Admitting: Pulmonary Disease

## 2022-06-13 ENCOUNTER — Ambulatory Visit: Payer: BC Managed Care – PPO | Admitting: Pulmonary Disease

## 2022-06-13 VITALS — BP 138/70 | HR 110 | Temp 97.9°F | Ht 70.0 in | Wt 201.0 lb

## 2022-06-13 DIAGNOSIS — R000 Tachycardia, unspecified: Secondary | ICD-10-CM

## 2022-06-13 DIAGNOSIS — I2584 Coronary atherosclerosis due to calcified coronary lesion: Secondary | ICD-10-CM

## 2022-06-13 DIAGNOSIS — R0609 Other forms of dyspnea: Secondary | ICD-10-CM

## 2022-06-13 DIAGNOSIS — I2510 Atherosclerotic heart disease of native coronary artery without angina pectoris: Secondary | ICD-10-CM | POA: Diagnosis present

## 2022-06-13 DIAGNOSIS — E7800 Pure hypercholesterolemia, unspecified: Secondary | ICD-10-CM | POA: Insufficient documentation

## 2022-06-13 DIAGNOSIS — R079 Chest pain, unspecified: Secondary | ICD-10-CM | POA: Insufficient documentation

## 2022-06-13 DIAGNOSIS — J4530 Mild persistent asthma, uncomplicated: Secondary | ICD-10-CM

## 2022-06-13 DIAGNOSIS — I493 Ventricular premature depolarization: Secondary | ICD-10-CM | POA: Insufficient documentation

## 2022-06-13 LAB — ECHOCARDIOGRAM COMPLETE
Area-P 1/2: 2.75 cm2
Height: 70 in
S' Lateral: 2.3 cm
Weight: 3216 oz

## 2022-06-13 MED ORDER — FLUTICASONE FUROATE-VILANTEROL 200-25 MCG/ACT IN AEPB: 1.0000 | INHALATION_SPRAY | Freq: Every day | RESPIRATORY_TRACT | 5 refills | Status: DC

## 2022-06-13 NOTE — Progress Notes (Addendum)
Dennis Lozano    [id number]    17-Apr-1956  Primary Care Physician:Pharr, Zollie Beckers, MD  Referring Physician: Merri Brunette, MD [address] [place],  [place] [address]  Chief complaint: Follow up for atypical chest pain, spitting up blood.  HPI: 66 year old with history of rheumatoid arthritis, asthma, GERD Complains of left chest tightness, discomfort for the past 2 months.  No particular aggravating or relieving factors.  He reports that the pain is getting better over time.  He was seen by Dr. Renne Crigler, primary care and had an EKG which is reportedly normal.  He also had a chest x-ray with no acute abnormality. Also reports spitting up blood a couple of times over the past 2 years.  He has not coughed up any mucus.  Feels that the blood could be coming from his teeth.  Has a history of asthma.  He has been prescribed Breo 100/25 and Proventil.  He is using the [facility] very rarely and not using the Proventil at all.  Denies any cough, sputum production, wheezing, dyspnea.  Denies seasonal allergies.  He has occasional GERD symptoms History noted for rheumatoid arthritis.  He is currently on Enbrel.  Follows with Dr. Dierdre Forth, rheumatology.  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  Does not use down comforters or pillows. Smoking history: Never smoker Travel history: Lived in [place], [place], [place] Relevant family history: No significant family history of lung issues.  Interim history: Here for review of CT scan Continues on Breo which he feels is helping  States that his symptoms of chest tightness and discomfort have improved.  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 [place]az[place] from cardiology who has ordered echocardiogram and stress  test.  Outpatient Encounter Medications as of 06/13/2022  Medication Sig   aspirin EC 81 MG tablet Take 1 tablet (81 mg total) by mouth daily. Swallow whole.   ENBREL SURECLICK 50 MG/ML injection    fluticasone furoate-vilanterol (BREO ELLIPTA) 200-25 MCG/ACT AEPB USE ONE PUFF ONCE A DAY.   pantoprazole (PROTONIX) 40 MG tablet 1 tablet   No facility-administered encounter medications on file as of 06/13/2022.   Physical Exam: Blood pressure 138/70, pulse (!) 110, temperature 97.9 F (36.6 C), temperature source Oral, height 5\' 10"  (1.778 m), weight 201 lb (91.2 kg), SpO2 96 %. Gen:      No acute distress HEENT:  EOMI, sclera anicteric Neck:     No masses; no thyromegaly Lungs:    Clear to auscultation bilaterally; normal respiratory effort CV:         Regular rate and rhythm; no murmurs Abd:      + bowel sounds; soft, non-tender; no palpable masses, no distension Ext:    No edema; adequate peripheral perfusion Skin:      Warm and dry; no rash Neuro: alert and oriented x 3 Psych: normal mood and affect   Data Reviewed: Imaging: Chest x-ray 01/17/2018- no acute cardiopulmonary normality.  High-resolution CT 04/24/2022-no evidence of interstitial lung disease, mild bronchiolectasis in the posterior medial right lower lobe.  I have reviewed the images personally.  PFTs  02/26/2011 FVC 3.83,  , FEV1 2.79 [83%], F/F 73, TLC 92%, DLCO 123% No obstruction or bronchodilator response.  Normal lung volumes and diffusion capacity.  02/26/2022  FVC 3.00 [6 6%], FEV1 2.19 [65%], F/F 73, SVC 3.54 [78%], DLCO 30.99 [118%] Restriction suggested by reduced FVC and SVC  Assessment:  Chest discomfort, hemoptysis Chest discomfort is atypical in nature.  Hemoptysis has resolved CT scan reviewed with no interstitial lung disease.  There is minimal bronchiolectasis at the right base which does not appear to be significant Discussed with patient.   Suspect some of her symptoms are secondary to  significant stress at work which is improving He has a stress test and echocardiogram already pending which was ordered by his cardiologist  Mild persistent asthma Symptoms are well controlled.  Continue Breo inhaler  Plan/Recommendations: - Continue Breo  Follow-up in 6 months  Chilton Greathouse MD [facility] 06/13/2022, 9:39 AM  CC: Merri Brunette, MD   Addendum: Reviewed notes from primary care dated 06/26/2023 CMP within normal limits, CBC within normal Referred to gastroenterology for right upper quadrant pain Continues on Enbrel for rheumatoid arthritis.

## 2022-06-13 NOTE — Patient Instructions (Signed)
I am glad you are doing better with your breathing I had reviewed his CT scan which shows very minimal nonspecific changes which are not of concern Continue follow-up with cardiology as planned Continue breo Follow-up in 6 months with either video visit or in person visit.

## 2022-06-19 ENCOUNTER — Telehealth (HOSPITAL_COMMUNITY): Payer: Self-pay | Admitting: *Deleted

## 2022-06-19 NOTE — Telephone Encounter (Signed)
Close encounter 

## 2022-06-20 ENCOUNTER — Ambulatory Visit (HOSPITAL_COMMUNITY)
Admission: RE | Admit: 2022-06-20 | Discharge: 2022-06-20 | Disposition: A | Discharge status: Home / Self Care | Payer: BC Managed Care – PPO | Source: Ambulatory Visit | Attending: Cardiology | Admitting: Cardiology

## 2022-06-20 DIAGNOSIS — I2584 Coronary atherosclerosis due to calcified coronary lesion: Secondary | ICD-10-CM | POA: Insufficient documentation

## 2022-06-20 DIAGNOSIS — I2510 Atherosclerotic heart disease of native coronary artery without angina pectoris: Secondary | ICD-10-CM | POA: Diagnosis present

## 2022-06-20 DIAGNOSIS — R079 Chest pain, unspecified: Secondary | ICD-10-CM | POA: Insufficient documentation

## 2022-06-20 DIAGNOSIS — I493 Ventricular premature depolarization: Secondary | ICD-10-CM | POA: Diagnosis present

## 2022-06-20 DIAGNOSIS — E7800 Pure hypercholesterolemia, unspecified: Secondary | ICD-10-CM | POA: Insufficient documentation

## 2022-06-20 DIAGNOSIS — R000 Tachycardia, unspecified: Secondary | ICD-10-CM | POA: Insufficient documentation

## 2022-06-20 LAB — MYOCARDIAL PERFUSION IMAGING
Angina Index: 0
Duke Treadmill Score: 8
Estimated workload: 9.3
Exercise duration (min): 7 min
Exercise duration (sec): 31 s
LV dias vol: 107 mL (ref 62–150)
LV sys vol: 53 mL
MPHR: 152 {beats}/min
Nuc Stress EF: 51 %
Peak HR: 155 {beats}/min
Percent HR: 101 %
Rest HR: 59 {beats}/min
Rest Nuclear Isotope Dose: 10.5 mCi
SDS: 4
SRS: 4
SSS: 8
ST Depression (mm): 0 mm
Stress Nuclear Isotope Dose: 32.6 mCi
TID: 0.93

## 2022-06-20 MED ORDER — TECHNETIUM TC 99M TETROFOSMIN IV KIT
32.6000 | PACK | Freq: Once | INTRAVENOUS | Status: AC | PRN
  Administered 2022-06-20: 32.6 via INTRAVENOUS

## 2022-06-20 MED ORDER — TECHNETIUM TC 99M TETROFOSMIN IV KIT
10.5000 | PACK | Freq: Once | INTRAVENOUS | Status: AC | PRN
  Administered 2022-06-20: 10.5 via INTRAVENOUS

## 2022-07-15 NOTE — Progress Notes (Unsigned)
Cardiology Office Note:    Date:  07/18/2022   ID:  Dennis Lozano, DOB [birth year], MRN [id number]  PCP:  Deland Pretty, [place] Providers Cardiologist:  None     Referring MD: Deland Pretty, MD   Chief Complaint  Patient presents with   Follow-up    History of Present Illness:    Dennis Lozano is a 66 y.o. male seen at the request of Dr Shelia Media for evaluation of coronary calcification noted on CT. He has a history of RA and HLD. He was seen remotely by Dr Acie Fredrickson for evaluation of PVCs in 2015. These were felt to be benign. 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   The patient notes that earlier this year he was experiencing increased symptoms of palpitations. Some episodes were c/w his prior PVCs but at other times he felt his heart racing. He has a Kardio mobile device and recorded some episodes of HR up to 217 with wide complex. This would resolve without intervention. He was also experiencing symptoms of chest pain the central chest. No radiation. No SOB. No syncope. He notes these symptoms were all related to significant stress at the time. 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 Of note he did have CT chest scans in 2021 and more recently in August showing dense coronary calcification of the left main, LAD and LCx.   According to Zio patch monitor he had 1 run of NSVT lasting 7 beats. 30 runs of SVT longest lasting 14.7 seconds.   On follow up today he continues to feel well. Denies chest pain, dyspnea and also notes arrhythmia has essentially resolved.   Past Medical History:  Diagnosis Date   Arthritis    Asthma    slight    Coronary artery disease    GERD (gastroesophageal reflux disease)    occasion    Hyperlipidemia    Rheumatoid arthritis(714.0)    Vitamin D deficiency     Past Surgical History:  Procedure Laterality Date   APPENDECTOMY     OLECRANON BURSECTOMY      TOTAL HIP ARTHROPLASTY Left 09/03/2016   Procedure: LEFT TOTAL HIP ARTHROPLASTY ANTERIOR APPROACH;  Surgeon: Gaynelle Arabian, MD;  Location: [facility];  Service: Orthopedics;  Laterality: Left;    Current Medications: Current Meds  Medication Sig   aspirin EC 81 MG tablet Take 1 tablet (81 mg total) by mouth daily. Swallow whole.   ENBREL SURECLICK 50 MG/ML injection    fluticasone furoate-vilanterol (BREO ELLIPTA) 200-25 MCG/ACT AEPB Inhale 1 puff into the lungs daily.   loteprednol (LOTEMAX) 0.5 % ophthalmic suspension SMARTSIG:In Eye(s)   pantoprazole (PROTONIX) 40 MG tablet 1 tablet     Allergies:   Penicillins   Social History   Socioeconomic History   Marital status: Married    Spouse name: Not on file   Number of children: 0   Years of education: Not on file   Highest education level: Not on file  Occupational History   Occupation: professor    Employer: [employer]  Tobacco Use   Smoking status: Never   Smokeless tobacco: Never  Substance and Sexual Activity   Alcohol use: Yes    Comment: occasional, beer once per week    Drug use: No   Sexual activity: Not on file  Other Topics Concern   Not on file  Social History Narrative   Teaching  in Nutrition at [employer]   Social Determinants of Health   Financial Resource Strain: Not on file  Food Insecurity: Not on file  Transportation Needs: Not on file  Physical Activity: Not on file  Stress: Not on file  Social Connections: Not on file     Family History: The patient's family history includes Rheum arthritis in his mother.  ROS:   Please see the history of present illness.     All other systems reviewed and are negative.  EKGs/Labs/Other Studies Reviewed:    The following studies were reviewed today: CT CHEST WITHOUT CONTRAST   TECHNIQUE: Multidetector CT imaging of the chest was performed following the standard protocol without intravenous contrast. High resolution imaging of the lungs, as well as  inspiratory and expiratory imaging, was performed.   RADIATION DOSE REDUCTION: This exam was performed according to the departmental dose-optimization program which includes automated exposure control, adjustment of the mA and/or kV according to patient size and/or use of iterative reconstruction technique.   COMPARISON:  [id number] chest CT.   FINDINGS: Cardiovascular: Normal heart size. No significant pericardial effusion/thickening. Left anterior descending and right coronary atherosclerosis. Atherosclerotic nonaneurysmal thoracic aorta. Normal caliber pulmonary arteries.   Mediastinum/Nodes: No discrete thyroid nodules. Unremarkable esophagus. No pathologically enlarged axillary, mediastinal or hilar lymph nodes, noting limited sensitivity for the detection of hilar adenopathy on this noncontrast study.   Lungs/Pleura: No pneumothorax. No pleural effusion. No acute consolidative airspace disease, lung masses or significant pulmonary nodules. No significant lobular air trapping or evidence of tracheobronchomalacia on the expiration sequence. No significant regions of subpleural reticulation, ground-glass opacity, architectural distortion or frank honeycombing. Mild cylindrical bronchiolectasis in the posteromedial basilar right lower lobe (series 10/images 225 and 235). Thin parenchymal bands in the inferior lingula are unchanged and compatible with nonspecific mild postinfectious/postinflammatory scarring.   Upper abdomen: Indistinct subcapsular region of hyperdensity in the central liver adjacent to the porta hepatis (series 2/image 138), not appreciably changed back to the 02/12/2018 chest CT, most compatible with a benign etiology such as mild fatty sparing. Simple exophytic 1.4 cm upper right renal cyst, for which no follow-up imaging is recommended.   Musculoskeletal: No aggressive appearing focal osseous lesions. Moderate thoracic spondylosis.   IMPRESSION: 1. No  evidence of interstitial lung disease. 2. Mild cylindrical bronchiolectasis in the posteromedial basilar right lower lobe. 3. Two-vessel coronary atherosclerosis. 4. Aortic Atherosclerosis (ICD10-I70.0).     Electronically Signed   By: Delbert PhenixJason A Poff M.D.   On: 04/24/2022 14:50   Echo 06/13/22: IMPRESSIONS     1. Left ventricular ejection fraction, by estimation, is 55 to 60%. The  left ventricle has normal function. The left ventricle has no regional  wall motion abnormalities. Left ventricular diastolic parameters are  consistent with Grade I diastolic  dysfunction (impaired relaxation).   2. Right ventricular systolic function is normal. The right ventricular  size is normal. Tricuspid regurgitation signal is inadequate for assessing  PA pressure.   3. The mitral valve is normal in structure. No evidence of mitral valve  regurgitation. No evidence of mitral stenosis.   4. The aortic valve is tricuspid. There is mild calcification of the  aortic valve. Aortic valve regurgitation is not visualized. No aortic  stenosis is present.   5. The inferior vena cava is normal in size with greater than 50%  respiratory variability, suggesting right atrial pressure of 3 mmHg.    Myoview 06/20/22: Study Highlights      Findings are consistent with no prior  ischemia. The study is low risk.   No ST deviation was noted.   LV perfusion is abnormal. There is no evidence of ischemia. Defect 1: There is a small defect with mild reduction in uptake present in the apical apex location(s) that is fixed. There is normal wall motion in the defect area. Consistent with small apical infarction or apical thinning artifact.   Left ventricular function is abnormal. Global function is mildly reduced. There were no regional wall motion abnormalities. End diastolic cavity size is normal.   Prior study not available for comparison.   Intermediate risk stress nuclear study with a small fixed apical perfusion  defect that may represent a small infarction or apical thinning artifact. Reversible ischemia is not present.  Mildly reduced left ventricular global systolic function.    EKG:  EKG is not  ordered today.    Recent Labs: No results found for requested labs within last 365 days.  Recent Lipid Panel No results found for: "CHOL", "TRIG", "HDL", "CHOLHDL", "VLDL", "LDLCALC", "LDLDIRECT"  Dated 12/13/21: creatinine 1.14. otherwise CMET and CBC normal Dated 04/26/22: cholesterol 175, triglycerides 186, HDL 35. Non HDL 140. LDL 103. CMET normal.    Risk Assessment/Calculations:                Physical Exam:    VS:  BP 118/72 (BP Location: Left Arm, Patient Position: Sitting, Cuff Size: Large)   Pulse 70   Ht 5\' 10"  (1.778 m)   Wt 202 lb 3.2 oz (91.7 kg)   SpO2 98%   BMI 29.01 kg/m     Wt Readings from Last 3 Encounters:  07/18/22 202 lb 3.2 oz (91.7 kg)  06/20/22 201 lb (91.2 kg)  06/13/22 201 lb (91.2 kg)     GEN:  Well nourished, well developed in no acute distress HEENT: Normal NECK: No JVD; No carotid bruits LYMPHATICS: No lymphadenopathy CARDIAC: RRR, no murmurs, rubs, gallops RESPIRATORY:  Clear to auscultation without rales, wheezing or rhonchi  ABDOMEN: Soft, non-tender, non-distended MUSCULOSKELETAL:  No edema; No deformity  SKIN: Warm and dry NEUROLOGIC:  Alert and oriented x 3 PSYCHIATRIC:  Normal affect   ASSESSMENT:    1. PVC (premature ventricular contraction)   2. Wide-complex tachycardia   3. Coronary artery calcification   4. Hypercholesterolemia     PLAN:    In order of problems listed above:  Significant coronary calcification noted on CT. Low risk Myoview with small fixed apical defect. No ischemia. Echo is normal. Recommend focus on risk factor modification Wide complex tachycardia. Reviewed severe monitor strips from his Kardio mobile device with wide complex tachycardia. Recent Zio patch monitor placed by Dr Shelia Media- also showed short runs of  arrhythmia. Symptoms have resolved. Given normal EF and no significant ischemia on testing and no symptoms we can defer further treatment.  Hyperlipidemia. Given coronary calcification discussed starting statin therapy. Goal LDL <70.  Patient is reluctant to take a statin. Would like to try lifestyle modification.  Stressed importance of prevention. Will follow up with Dr Shelia Media RA Chronic PVCs.    Will follow up PRN at this point. May contact us if he develops new or worsening symptoms  Signed, Kashawna Manzer Martinique, MD  07/18/2022 3:11 PM    [place]

## 2022-07-18 ENCOUNTER — Encounter: Payer: Self-pay | Admitting: Cardiology

## 2022-07-18 ENCOUNTER — Ambulatory Visit: Payer: BC Managed Care – PPO | Attending: Cardiology | Admitting: Cardiology

## 2022-07-18 VITALS — BP 118/72 | HR 70 | Ht 70.0 in | Wt 202.2 lb

## 2022-07-18 DIAGNOSIS — I2584 Coronary atherosclerosis due to calcified coronary lesion: Secondary | ICD-10-CM

## 2022-07-18 DIAGNOSIS — I2510 Atherosclerotic heart disease of native coronary artery without angina pectoris: Secondary | ICD-10-CM

## 2022-07-18 DIAGNOSIS — I493 Ventricular premature depolarization: Secondary | ICD-10-CM

## 2022-07-18 DIAGNOSIS — E7800 Pure hypercholesterolemia, unspecified: Secondary | ICD-10-CM | POA: Diagnosis not present

## 2022-07-18 DIAGNOSIS — R000 Tachycardia, unspecified: Secondary | ICD-10-CM

## 2023-07-03 ENCOUNTER — Other Ambulatory Visit: Payer: Self-pay | Admitting: Pulmonary Disease

## 2023-07-05 ENCOUNTER — Other Ambulatory Visit: Payer: Self-pay | Admitting: Pulmonary Disease

## 2023-08-01 ENCOUNTER — Telehealth: Payer: Self-pay | Admitting: Pulmonary Disease

## 2023-08-01 MED ORDER — FLUTICASONE FUROATE-VILANTEROL 200-25 MCG/ACT IN AEPB: 1.0000 | INHALATION_SPRAY | Freq: Every day | RESPIRATORY_TRACT | 1 refills | Status: DC

## 2023-08-01 NOTE — Telephone Encounter (Signed)
[address] says BREO 90 day has to be sent for the mail order

## 2023-08-01 NOTE — Telephone Encounter (Signed)
Patient wanted Breo to be sent to [facility].

## 2023-08-14 DIAGNOSIS — Z1211 Encounter for screening for malignant neoplasm of colon: Secondary | ICD-10-CM | POA: Diagnosis not present

## 2023-08-14 DIAGNOSIS — K5730 Diverticulosis of large intestine without perforation or abscess without bleeding: Secondary | ICD-10-CM | POA: Diagnosis not present

## 2023-09-24 DIAGNOSIS — I10 Essential (primary) hypertension: Secondary | ICD-10-CM | POA: Diagnosis not present

## 2023-10-03 DIAGNOSIS — K760 Fatty (change of) liver, not elsewhere classified: Secondary | ICD-10-CM | POA: Diagnosis not present

## 2023-10-03 DIAGNOSIS — Z79899 Other long term (current) drug therapy: Secondary | ICD-10-CM | POA: Diagnosis not present

## 2023-10-03 DIAGNOSIS — M1991 Primary osteoarthritis, unspecified site: Secondary | ICD-10-CM | POA: Diagnosis not present

## 2023-10-03 DIAGNOSIS — M0609 Rheumatoid arthritis without rheumatoid factor, multiple sites: Secondary | ICD-10-CM | POA: Diagnosis not present

## 2023-10-03 DIAGNOSIS — E663 Overweight: Secondary | ICD-10-CM | POA: Diagnosis not present

## 2023-10-03 DIAGNOSIS — M7021 Olecranon bursitis, right elbow: Secondary | ICD-10-CM | POA: Diagnosis not present

## 2023-10-03 DIAGNOSIS — Z111 Encounter for screening for respiratory tuberculosis: Secondary | ICD-10-CM | POA: Diagnosis not present

## 2023-10-03 DIAGNOSIS — Z6829 Body mass index (BMI) 29.0-29.9, adult: Secondary | ICD-10-CM | POA: Diagnosis not present

## 2023-10-09 DIAGNOSIS — L821 Other seborrheic keratosis: Secondary | ICD-10-CM | POA: Diagnosis not present

## 2023-10-09 DIAGNOSIS — B351 Tinea unguium: Secondary | ICD-10-CM | POA: Diagnosis not present

## 2023-10-09 DIAGNOSIS — L853 Xerosis cutis: Secondary | ICD-10-CM | POA: Diagnosis not present

## 2023-10-23 DIAGNOSIS — R253 Fasciculation: Secondary | ICD-10-CM | POA: Diagnosis not present

## 2023-10-23 DIAGNOSIS — I10 Essential (primary) hypertension: Secondary | ICD-10-CM | POA: Diagnosis not present

## 2023-10-23 DIAGNOSIS — R7401 Elevation of levels of liver transaminase levels: Secondary | ICD-10-CM | POA: Diagnosis not present

## 2023-11-06 DIAGNOSIS — R7401 Elevation of levels of liver transaminase levels: Secondary | ICD-10-CM | POA: Diagnosis not present

## 2023-11-06 DIAGNOSIS — R253 Fasciculation: Secondary | ICD-10-CM | POA: Diagnosis not present

## 2023-11-19 DIAGNOSIS — I10 Essential (primary) hypertension: Secondary | ICD-10-CM | POA: Diagnosis not present

## 2023-11-20 DIAGNOSIS — R7401 Elevation of levels of liver transaminase levels: Secondary | ICD-10-CM | POA: Diagnosis not present

## 2023-11-20 DIAGNOSIS — E871 Hypo-osmolality and hyponatremia: Secondary | ICD-10-CM | POA: Diagnosis not present

## 2023-11-20 DIAGNOSIS — I10 Essential (primary) hypertension: Secondary | ICD-10-CM | POA: Diagnosis not present

## 2023-12-02 DIAGNOSIS — H16223 Keratoconjunctivitis sicca, not specified as Sjogren's, bilateral: Secondary | ICD-10-CM | POA: Diagnosis not present

## 2023-12-30 DIAGNOSIS — I10 Essential (primary) hypertension: Secondary | ICD-10-CM | POA: Diagnosis not present

## 2023-12-30 DIAGNOSIS — M6281 Muscle weakness (generalized): Secondary | ICD-10-CM | POA: Diagnosis not present

## 2023-12-30 DIAGNOSIS — R7401 Elevation of levels of liver transaminase levels: Secondary | ICD-10-CM | POA: Diagnosis not present

## 2023-12-30 DIAGNOSIS — R1012 Left upper quadrant pain: Secondary | ICD-10-CM | POA: Diagnosis not present

## 2023-12-30 DIAGNOSIS — R253 Fasciculation: Secondary | ICD-10-CM | POA: Diagnosis not present

## 2024-01-14 DIAGNOSIS — H35372 Puckering of macula, left eye: Secondary | ICD-10-CM | POA: Diagnosis not present

## 2024-01-14 DIAGNOSIS — H35362 Drusen (degenerative) of macula, left eye: Secondary | ICD-10-CM | POA: Diagnosis not present

## 2024-01-14 DIAGNOSIS — H16223 Keratoconjunctivitis sicca, not specified as Sjogren's, bilateral: Secondary | ICD-10-CM | POA: Diagnosis not present

## 2024-02-11 ENCOUNTER — Ambulatory Visit: Admitting: Cardiology

## 2024-03-18 DIAGNOSIS — Z0000 Encounter for general adult medical examination without abnormal findings: Secondary | ICD-10-CM | POA: Diagnosis not present

## 2024-03-18 DIAGNOSIS — R253 Fasciculation: Secondary | ICD-10-CM | POA: Diagnosis not present

## 2024-04-14 DIAGNOSIS — R7401 Elevation of levels of liver transaminase levels: Secondary | ICD-10-CM | POA: Diagnosis not present

## 2024-04-23 DIAGNOSIS — R7401 Elevation of levels of liver transaminase levels: Secondary | ICD-10-CM | POA: Insufficient documentation

## 2024-04-23 DIAGNOSIS — I10 Essential (primary) hypertension: Secondary | ICD-10-CM | POA: Insufficient documentation

## 2024-04-23 DIAGNOSIS — G4700 Insomnia, unspecified: Secondary | ICD-10-CM | POA: Insufficient documentation

## 2024-04-23 DIAGNOSIS — B351 Tinea unguium: Secondary | ICD-10-CM | POA: Insufficient documentation

## 2024-04-23 DIAGNOSIS — R3129 Other microscopic hematuria: Secondary | ICD-10-CM | POA: Insufficient documentation

## 2024-04-23 DIAGNOSIS — J4530 Mild persistent asthma, uncomplicated: Secondary | ICD-10-CM | POA: Insufficient documentation

## 2024-04-23 DIAGNOSIS — I700 Atherosclerosis of aorta: Secondary | ICD-10-CM | POA: Insufficient documentation

## 2024-04-23 DIAGNOSIS — Z87898 Personal history of other specified conditions: Secondary | ICD-10-CM | POA: Insufficient documentation

## 2024-04-23 DIAGNOSIS — R682 Dry mouth, unspecified: Secondary | ICD-10-CM | POA: Insufficient documentation

## 2024-04-23 DIAGNOSIS — E663 Overweight: Secondary | ICD-10-CM | POA: Insufficient documentation

## 2024-04-23 DIAGNOSIS — R7303 Prediabetes: Secondary | ICD-10-CM | POA: Insufficient documentation

## 2024-04-23 DIAGNOSIS — R253 Fasciculation: Secondary | ICD-10-CM | POA: Insufficient documentation

## 2024-04-23 DIAGNOSIS — I444 Left anterior fascicular block: Secondary | ICD-10-CM | POA: Insufficient documentation

## 2024-04-23 DIAGNOSIS — E559 Vitamin D deficiency, unspecified: Secondary | ICD-10-CM | POA: Insufficient documentation

## 2024-04-23 DIAGNOSIS — K219 Gastro-esophageal reflux disease without esophagitis: Secondary | ICD-10-CM | POA: Insufficient documentation

## 2024-04-23 DIAGNOSIS — K2100 Gastro-esophageal reflux disease with esophagitis, without bleeding: Secondary | ICD-10-CM | POA: Insufficient documentation

## 2024-04-23 DIAGNOSIS — K760 Fatty (change of) liver, not elsewhere classified: Secondary | ICD-10-CM | POA: Insufficient documentation

## 2024-04-23 DIAGNOSIS — Z8781 Personal history of (healed) traumatic fracture: Secondary | ICD-10-CM | POA: Insufficient documentation

## 2024-04-23 DIAGNOSIS — E871 Hypo-osmolality and hyponatremia: Secondary | ICD-10-CM | POA: Insufficient documentation

## 2024-04-23 DIAGNOSIS — Z0000 Encounter for general adult medical examination without abnormal findings: Secondary | ICD-10-CM | POA: Insufficient documentation

## 2024-04-23 DIAGNOSIS — Z880 Allergy status to penicillin: Secondary | ICD-10-CM | POA: Insufficient documentation

## 2024-04-23 DIAGNOSIS — D696 Thrombocytopenia, unspecified: Secondary | ICD-10-CM | POA: Insufficient documentation

## 2024-04-23 DIAGNOSIS — I2510 Atherosclerotic heart disease of native coronary artery without angina pectoris: Secondary | ICD-10-CM | POA: Insufficient documentation

## 2024-04-27 ENCOUNTER — Telehealth: Payer: Self-pay | Admitting: Neurology

## 2024-04-27 ENCOUNTER — Ambulatory Visit: Admitting: Neurology

## 2024-04-27 NOTE — Telephone Encounter (Signed)
 My chart provider out sick

## 2024-06-02 DIAGNOSIS — M0609 Rheumatoid arthritis without rheumatoid factor, multiple sites: Secondary | ICD-10-CM | POA: Diagnosis not present

## 2024-06-02 DIAGNOSIS — Z79899 Other long term (current) drug therapy: Secondary | ICD-10-CM | POA: Diagnosis not present

## 2024-06-02 DIAGNOSIS — K760 Fatty (change of) liver, not elsewhere classified: Secondary | ICD-10-CM | POA: Diagnosis not present

## 2024-06-02 DIAGNOSIS — E663 Overweight: Secondary | ICD-10-CM | POA: Diagnosis not present

## 2024-06-02 DIAGNOSIS — Z6828 Body mass index (BMI) 28.0-28.9, adult: Secondary | ICD-10-CM | POA: Diagnosis not present

## 2024-06-02 DIAGNOSIS — M1991 Primary osteoarthritis, unspecified site: Secondary | ICD-10-CM | POA: Diagnosis not present

## 2024-06-02 DIAGNOSIS — M7021 Olecranon bursitis, right elbow: Secondary | ICD-10-CM | POA: Diagnosis not present

## 2024-06-24 DIAGNOSIS — E782 Mixed hyperlipidemia: Secondary | ICD-10-CM | POA: Diagnosis not present

## 2024-06-24 DIAGNOSIS — I10 Essential (primary) hypertension: Secondary | ICD-10-CM | POA: Diagnosis not present

## 2024-06-24 DIAGNOSIS — R7303 Prediabetes: Secondary | ICD-10-CM | POA: Diagnosis not present

## 2024-06-24 DIAGNOSIS — Z125 Encounter for screening for malignant neoplasm of prostate: Secondary | ICD-10-CM | POA: Diagnosis not present

## 2024-06-29 DIAGNOSIS — M0609 Rheumatoid arthritis without rheumatoid factor, multiple sites: Secondary | ICD-10-CM | POA: Diagnosis not present

## 2024-06-29 DIAGNOSIS — J4530 Mild persistent asthma, uncomplicated: Secondary | ICD-10-CM | POA: Diagnosis not present

## 2024-06-29 DIAGNOSIS — I2510 Atherosclerotic heart disease of native coronary artery without angina pectoris: Secondary | ICD-10-CM | POA: Diagnosis not present

## 2024-06-29 DIAGNOSIS — M3500 Sicca syndrome, unspecified: Secondary | ICD-10-CM | POA: Diagnosis not present

## 2024-06-29 DIAGNOSIS — D84821 Immunodeficiency due to drugs: Secondary | ICD-10-CM | POA: Diagnosis not present

## 2024-06-29 DIAGNOSIS — Z0000 Encounter for general adult medical examination without abnormal findings: Secondary | ICD-10-CM | POA: Diagnosis not present

## 2024-06-29 DIAGNOSIS — D72819 Decreased white blood cell count, unspecified: Secondary | ICD-10-CM | POA: Diagnosis not present

## 2024-06-29 DIAGNOSIS — E871 Hypo-osmolality and hyponatremia: Secondary | ICD-10-CM | POA: Diagnosis not present

## 2024-06-29 DIAGNOSIS — Z23 Encounter for immunization: Secondary | ICD-10-CM | POA: Diagnosis not present

## 2024-06-29 DIAGNOSIS — D696 Thrombocytopenia, unspecified: Secondary | ICD-10-CM | POA: Diagnosis not present

## 2024-07-08 DIAGNOSIS — R253 Fasciculation: Secondary | ICD-10-CM | POA: Diagnosis not present

## 2024-07-13 ENCOUNTER — Ambulatory Visit (INDEPENDENT_AMBULATORY_CARE_PROVIDER_SITE_OTHER)

## 2024-07-13 ENCOUNTER — Encounter (INDEPENDENT_AMBULATORY_CARE_PROVIDER_SITE_OTHER): Payer: Self-pay

## 2024-07-13 VITALS — BP 149/88 | HR 67 | Temp 98.0°F | Ht 69.0 in | Wt 186.0 lb

## 2024-07-13 DIAGNOSIS — R682 Dry mouth, unspecified: Secondary | ICD-10-CM

## 2024-07-13 NOTE — Progress Notes (Unsigned)
 HPI:   Dennis Lozano is a 68 y.o. male who presents as a new patient being seen in consultation for dry mouth.   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  Social Connections: Socially Integrated (03/18/2024)   Received from [facility]   Social Network    How would you rate your social network (family, work, friends)?: Good participation with social networks    Current Outpatient Medications:    amLODipine (NORVASC) 5 MG tablet, Take 5 mg by mouth daily., Disp: , Rfl:    ENBREL SURECLICK 50 MG/ML injection, , Disp: , Rfl:    fluticasone  furoate-vilanterol (BREO ELLIPTA ) 200-25 MCG/ACT AEPB, Inhale 1 puff into the lungs daily., Disp: 180 each, Rfl: 1   loteprednol (LOTEMAX) 0.5 % ophthalmic suspension, SMARTSIG:In Eye(s) (Patient not taking: Reported on 07/13/2024), Disp: , Rfl:    pantoprazole (PROTONIX) 40 MG tablet, 1 tablet (Patient not taking: Reported on 07/13/2024), Disp: , Rfl:  A complete ROS was performed with pertinent positives/negatives noted in the HPI. The remainder of the ROS are negative.   Physical Exam:  Ht 5' 9 (1.753 m)   Wt 186 lb (84.4 kg)   BMI 27.47 kg/m  General: Well developed, well nourished. No  acute distress. Voice *** Head/Face: Normocephalic. No sinus tenderness. Facial nerve intact and equal bilaterally. ***No facial lacerations. Eyes: PERRL, no scleral icterus or conjunctival hemorrhage. EOMI. Ears: No gross deformity. Normal external canal. Tympanic membrane *** bilaterally Hearing: Normal speech reception. *** Nose: No gross deformity or lesions. No purulent discharge. No turbinate hypertrophy. Mouth/Oropharynx: Lips without any lesions. Dentition ***. No mucosal lesions within the oropharynx. No tonsillar enlargement, exudate, or lesions. Pharyngeal walls symmetrical. Uvula midline. Tongue midline without lesions. Larynx: See TFL if applicable Nasopharynx: See TFL if applicable Neck: Trachea midline. No masses. No thyromegaly or nodules palpated. No crepitus. Lymphatic: No lymphadenopathy in the neck. Respiratory: No stridor or distress. Room air. Cardiovascular: Regular rate and rhythm. Extremities: No edema or cyanosis. Warm and well-perfused. Skin: No scars or lesions on face or neck. Neurologic: CN II-XII grossly intact. Moving all extremities without gross abnormality. Other:  Independent Review of Additional Tests or Records: None*** Procedures: None*** Impression & Plans: Dennis Lozano is a 68 y.o. male with *** - ***

## 2024-07-14 DIAGNOSIS — D72819 Decreased white blood cell count, unspecified: Secondary | ICD-10-CM | POA: Diagnosis not present

## 2024-07-14 DIAGNOSIS — E782 Mixed hyperlipidemia: Secondary | ICD-10-CM | POA: Diagnosis not present

## 2024-07-14 DIAGNOSIS — K760 Fatty (change of) liver, not elsewhere classified: Secondary | ICD-10-CM | POA: Diagnosis not present

## 2024-07-14 DIAGNOSIS — I10 Essential (primary) hypertension: Secondary | ICD-10-CM | POA: Diagnosis not present

## 2024-07-16 ENCOUNTER — Encounter (INDEPENDENT_AMBULATORY_CARE_PROVIDER_SITE_OTHER): Payer: Self-pay

## 2024-07-17 ENCOUNTER — Telehealth: Payer: Self-pay | Admitting: Neurology

## 2024-07-17 NOTE — Telephone Encounter (Signed)
 Pt cancel appt due to having another Provider   Appt Canceled

## 2024-07-21 ENCOUNTER — Other Ambulatory Visit: Payer: Self-pay | Admitting: Pulmonary Disease

## 2024-07-28 ENCOUNTER — Ambulatory Visit: Admitting: Neurology

## 2024-08-05 DIAGNOSIS — I10 Essential (primary) hypertension: Secondary | ICD-10-CM | POA: Diagnosis not present

## 2024-08-05 DIAGNOSIS — J4489 Other specified chronic obstructive pulmonary disease: Secondary | ICD-10-CM | POA: Diagnosis not present

## 2024-08-05 DIAGNOSIS — D84821 Immunodeficiency due to drugs: Secondary | ICD-10-CM | POA: Diagnosis not present

## 2024-08-05 DIAGNOSIS — K760 Fatty (change of) liver, not elsewhere classified: Secondary | ICD-10-CM | POA: Diagnosis not present

## 2024-08-05 DIAGNOSIS — M069 Rheumatoid arthritis, unspecified: Secondary | ICD-10-CM | POA: Diagnosis not present

## 2024-08-05 DIAGNOSIS — Z880 Allergy status to penicillin: Secondary | ICD-10-CM | POA: Diagnosis not present

## 2024-08-05 DIAGNOSIS — I2510 Atherosclerotic heart disease of native coronary artery without angina pectoris: Secondary | ICD-10-CM | POA: Diagnosis not present

## 2024-08-05 DIAGNOSIS — D696 Thrombocytopenia, unspecified: Secondary | ICD-10-CM | POA: Diagnosis not present

## 2024-08-05 DIAGNOSIS — I700 Atherosclerosis of aorta: Secondary | ICD-10-CM | POA: Diagnosis not present

## 2024-08-11 ENCOUNTER — Telehealth (INDEPENDENT_AMBULATORY_CARE_PROVIDER_SITE_OTHER): Payer: Self-pay

## 2024-08-11 NOTE — Telephone Encounter (Signed)
 Called pt to rs appt due to provider being ooo pt will be out of town offered rs w dif provider pt stated will cancel ct until rs with new provider

## 2024-08-17 ENCOUNTER — Ambulatory Visit (HOSPITAL_COMMUNITY)
Admission: RE | Admit: 2024-08-17 | Discharge: 2024-08-17 | Disposition: A | Discharge status: Home / Self Care | Source: Ambulatory Visit

## 2024-08-17 DIAGNOSIS — R682 Dry mouth, unspecified: Secondary | ICD-10-CM | POA: Diagnosis not present

## 2024-08-17 MED ORDER — IOHEXOL 350 MG/ML SOLN
75.0000 mL | Freq: Once | INTRAVENOUS | Status: AC | PRN
  Administered 2024-08-17: 75 mL via INTRAVENOUS

## 2024-08-24 ENCOUNTER — Other Ambulatory Visit: Payer: Self-pay

## 2024-08-25 MED ORDER — FLUTICASONE FUROATE-VILANTEROL 200-25 MCG/ACT IN AEPB: 1.0000 | INHALATION_SPRAY | Freq: Every day | RESPIRATORY_TRACT | 1 refills | Status: AC

## 2024-08-26 ENCOUNTER — Ambulatory Visit (INDEPENDENT_AMBULATORY_CARE_PROVIDER_SITE_OTHER)

## 2024-08-31 ENCOUNTER — Ambulatory Visit (INDEPENDENT_AMBULATORY_CARE_PROVIDER_SITE_OTHER)

## 2024-09-02 DIAGNOSIS — I10 Essential (primary) hypertension: Secondary | ICD-10-CM | POA: Diagnosis not present

## 2024-09-02 DIAGNOSIS — E782 Mixed hyperlipidemia: Secondary | ICD-10-CM | POA: Diagnosis not present

## 2024-09-29 ENCOUNTER — Encounter (INDEPENDENT_AMBULATORY_CARE_PROVIDER_SITE_OTHER): Payer: Self-pay | Admitting: Otolaryngology

## 2024-09-29 ENCOUNTER — Ambulatory Visit (INDEPENDENT_AMBULATORY_CARE_PROVIDER_SITE_OTHER): Admitting: Otolaryngology

## 2024-09-29 VITALS — BP 142/82 | HR 64

## 2024-09-29 DIAGNOSIS — J329 Chronic sinusitis, unspecified: Secondary | ICD-10-CM

## 2024-09-29 DIAGNOSIS — R682 Dry mouth, unspecified: Secondary | ICD-10-CM | POA: Diagnosis not present

## 2024-09-29 NOTE — Progress Notes (Signed)
 Reason for Consult: Dry mouth Referring Physician: Dr. Clarice Simas Dennis Lozano is an 69 y.o. male.  HPI: He is here for evaluation of dry mouth.  He has had some moisturization therapy and he feels like his salivary glands actually work.  He is more concerned this is a nerve function issue but he is better.  He had Sjogren's profile drawn previously which was negative.  It was a consideration to do a biopsy of his salivary glands.  He is not interested in that.  He does have a little bit of coloration to his mucus in the morning intermittently.  His CT scan did not show any salivary issues but did have a thickening in the right maxillary sinus consistent with a potential sinus inflammation.  He does not have any headaches, nasal purulent drainage, or facial pain.  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 He has never used smokeless tobacco. He reports current alcohol use. He reports that he does not use drugs.  Allergies: Allergies[1]   No results found for this or any previous visit (from the past 48 hours).  No results found.  ROS There were no vitals taken for this visit. Physical Exam Constitutional:      Appearance: Normal appearance.  HENT:     Head: Normocephalic and atraumatic.     Right Ear: Tympanic membrane is without lesions and middle ear aerated, ear canal and external  ear normal.     Left Ear: Tympanic membrane is without lesions and middle ear aerated, ear canal and external ear normal.     Nose: Nose without deviation of septum.  Turbinates with mild hypertrophy, No significant swelling or masses.     Oral cavity/oropharynx: Mucous membranes are moist. No lesions or masses    Larynx: normal voice. Mirror attempted without success    Eyes:     Extraocular Movements: Extraocular movements intact.     Conjunctiva/sclera: Conjunctivae normal.     Pupils: Pupils are equal, round, and reactive to light.  Cardiovascular:     Rate and Rhythm: Normal rate.  Pulmonary:     Effort: Pulmonary effort is normal.  Musculoskeletal:     Cervical back: Normal range of motion and neck supple. No rigidity.  Lymphadenopathy:     Cervical: No cervical adenopathy or masses.salivary glands without lesions. .     Salivary glands- no mass or swelling Neurological:     Mental Status: He is alert. CN 2-12 intact. No nystagmus      Assessment/Plan: Dry mouth-he is better and for now he does not want to entertain any intervention.  He does have some reflux that he notices sometimes he will have some of the food item that he ate the night before in the mucus.  He does  not want to try any reflux medication.  Since he is improving he will just see how he does over the next few months and come back if he wants further workup.  Right sided chronic sinusitis-I discussed this with him and that may be some of the issue with the coloration to the mucus.  He has had this for years.  We talked about antibiotics.  Right now he has no interest in pursuing this and will come back if anything worsens.  Norleen Notice 09/29/2024, 11:06 AM        [1]  Allergies Allergen Reactions   Penicillins Rash    Childhood allergy  Has patient had a PCN reaction causing immediate rash, facial/tongue/throat swelling, SOB or lightheadedness with hypotension: Yes Has patient had a PCN reaction causing  severe rash involving mucus membranes or skin necrosis: Unknown Has patient had a PCN reaction that required hospitalization Unknown Has patient had a PCN reaction occurring within the last 10 years: No If all of the above answers are NO, then may proceed with Cephalosporin use.
